# Patient Record
Sex: Female | Born: 1980 | Race: Black or African American | Hispanic: No | Marital: Single | Smoking: Never smoker
Health system: Southern US, Community
[De-identification: ages and names within clinical notes are randomized; demographics above are authoritative.]

## PROBLEM LIST (undated history)

## (undated) ENCOUNTER — Inpatient Hospital Stay (HOSPITAL_COMMUNITY): Payer: Self-pay

## (undated) DIAGNOSIS — F329 Major depressive disorder, single episode, unspecified: Secondary | ICD-10-CM

## (undated) DIAGNOSIS — K219 Gastro-esophageal reflux disease without esophagitis: Secondary | ICD-10-CM

## (undated) DIAGNOSIS — Z9289 Personal history of other medical treatment: Secondary | ICD-10-CM

## (undated) DIAGNOSIS — D649 Anemia, unspecified: Secondary | ICD-10-CM

## (undated) DIAGNOSIS — R87619 Unspecified abnormal cytological findings in specimens from cervix uteri: Secondary | ICD-10-CM

## (undated) DIAGNOSIS — R7303 Prediabetes: Secondary | ICD-10-CM

## (undated) DIAGNOSIS — R7989 Other specified abnormal findings of blood chemistry: Secondary | ICD-10-CM

## (undated) DIAGNOSIS — F419 Anxiety disorder, unspecified: Secondary | ICD-10-CM

## (undated) DIAGNOSIS — B009 Herpesviral infection, unspecified: Secondary | ICD-10-CM

## (undated) DIAGNOSIS — IMO0002 Reserved for concepts with insufficient information to code with codable children: Secondary | ICD-10-CM

## (undated) DIAGNOSIS — F32A Depression, unspecified: Secondary | ICD-10-CM

## (undated) HISTORY — DX: Other specified abnormal findings of blood chemistry: R79.89

## (undated) HISTORY — DX: Prediabetes: R73.03

## (undated) HISTORY — PX: OTHER SURGICAL HISTORY: SHX169

## (undated) HISTORY — DX: Herpesviral infection, unspecified: B00.9

## (undated) HISTORY — DX: Anxiety disorder, unspecified: F41.9

## (undated) HISTORY — DX: Unspecified abnormal cytological findings in specimens from cervix uteri: R87.619

## (undated) HISTORY — DX: Reserved for concepts with insufficient information to code with codable children: IMO0002

## (undated) HISTORY — DX: Depression, unspecified: F32.A

---
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
## 1999-05-03 ENCOUNTER — Encounter (INDEPENDENT_AMBULATORY_CARE_PROVIDER_SITE_OTHER): Payer: Self-pay

## 1999-05-03 ENCOUNTER — Other Ambulatory Visit: Admission: RE | Admit: 1999-05-03 | Discharge: 1999-05-03 | Payer: Self-pay | Admitting: *Deleted

## 1999-05-03 ENCOUNTER — Other Ambulatory Visit: Admission: RE | Admit: 1999-05-03 | Discharge: 1999-05-03 | Payer: Self-pay | Admitting: Obstetrics

## 2000-04-01 ENCOUNTER — Emergency Department (HOSPITAL_COMMUNITY): Admission: EM | Admit: 2000-04-01 | Discharge: 2000-04-01 | Payer: Self-pay | Admitting: Emergency Medicine

## 2000-04-03 ENCOUNTER — Emergency Department (HOSPITAL_COMMUNITY): Admission: EM | Admit: 2000-04-03 | Discharge: 2000-04-03 | Payer: Self-pay | Admitting: Emergency Medicine

## 2000-04-05 ENCOUNTER — Emergency Department (HOSPITAL_COMMUNITY): Admission: EM | Admit: 2000-04-05 | Discharge: 2000-04-05 | Payer: Self-pay | Admitting: Emergency Medicine

## 2000-04-11 ENCOUNTER — Inpatient Hospital Stay (HOSPITAL_COMMUNITY): Admission: AD | Admit: 2000-04-11 | Discharge: 2000-04-11 | Payer: Self-pay | Admitting: *Deleted

## 2000-05-06 ENCOUNTER — Inpatient Hospital Stay (HOSPITAL_COMMUNITY): Admission: AD | Admit: 2000-05-06 | Discharge: 2000-05-06 | Payer: Self-pay | Admitting: Obstetrics and Gynecology

## 2000-05-23 ENCOUNTER — Inpatient Hospital Stay (HOSPITAL_COMMUNITY): Admission: AD | Admit: 2000-05-23 | Discharge: 2000-05-23 | Payer: Self-pay | Admitting: Obstetrics and Gynecology

## 2000-05-29 ENCOUNTER — Other Ambulatory Visit: Admission: RE | Admit: 2000-05-29 | Discharge: 2000-05-29 | Payer: Self-pay | Admitting: Obstetrics and Gynecology

## 2000-07-10 ENCOUNTER — Ambulatory Visit (HOSPITAL_COMMUNITY): Admission: RE | Admit: 2000-07-10 | Discharge: 2000-07-10 | Payer: Self-pay | Admitting: Obstetrics and Gynecology

## 2000-07-10 ENCOUNTER — Encounter: Payer: Self-pay | Admitting: Obstetrics and Gynecology

## 2000-11-28 ENCOUNTER — Inpatient Hospital Stay (HOSPITAL_COMMUNITY): Admission: AD | Admit: 2000-11-28 | Discharge: 2000-11-30 | Payer: Self-pay | Admitting: Obstetrics and Gynecology

## 2002-01-05 ENCOUNTER — Emergency Department (HOSPITAL_COMMUNITY): Admission: EM | Admit: 2002-01-05 | Discharge: 2002-01-06 | Payer: Self-pay | Admitting: Emergency Medicine

## 2003-07-19 ENCOUNTER — Emergency Department (HOSPITAL_COMMUNITY): Admission: EM | Admit: 2003-07-19 | Discharge: 2003-07-19 | Payer: Self-pay | Admitting: Emergency Medicine

## 2003-09-21 ENCOUNTER — Encounter: Admission: RE | Admit: 2003-09-21 | Discharge: 2003-09-21 | Payer: Self-pay | Admitting: Family Medicine

## 2005-03-29 ENCOUNTER — Inpatient Hospital Stay (HOSPITAL_COMMUNITY): Admission: AD | Admit: 2005-03-29 | Discharge: 2005-03-29 | Payer: Self-pay | Admitting: *Deleted

## 2005-05-06 ENCOUNTER — Other Ambulatory Visit: Admission: RE | Admit: 2005-05-06 | Discharge: 2005-05-06 | Payer: Self-pay | Admitting: Obstetrics and Gynecology

## 2005-11-05 ENCOUNTER — Encounter (INDEPENDENT_AMBULATORY_CARE_PROVIDER_SITE_OTHER): Payer: Self-pay | Admitting: *Deleted

## 2005-11-05 ENCOUNTER — Inpatient Hospital Stay (HOSPITAL_COMMUNITY): Admission: AD | Admit: 2005-11-05 | Discharge: 2005-11-07 | Payer: Self-pay | Admitting: Obstetrics and Gynecology

## 2006-09-25 ENCOUNTER — Emergency Department (HOSPITAL_COMMUNITY): Admission: EM | Admit: 2006-09-25 | Discharge: 2006-09-25 | Payer: Self-pay | Admitting: Emergency Medicine

## 2009-02-17 ENCOUNTER — Emergency Department (HOSPITAL_COMMUNITY): Admission: EM | Admit: 2009-02-17 | Discharge: 2009-02-17 | Payer: Self-pay | Admitting: Emergency Medicine

## 2009-12-12 ENCOUNTER — Inpatient Hospital Stay (HOSPITAL_COMMUNITY): Admission: EM | Admit: 2009-12-12 | Discharge: 2009-12-13 | Payer: Self-pay | Admitting: Emergency Medicine

## 2010-08-18 LAB — DIFFERENTIAL
Basophils Absolute: 0 10*3/uL (ref 0.0–0.1)
Basophils Relative: 0 % (ref 0–1)
Eosinophils Relative: 0 % (ref 0–5)
Monocytes Absolute: 0.7 10*3/uL (ref 0.1–1.0)
Monocytes Relative: 10 % (ref 3–12)

## 2010-08-18 LAB — POCT CARDIAC MARKERS
Myoglobin, poc: 30.5 ng/mL (ref 12–200)
Troponin i, poc: <0.05 ng/mL (ref 0.00–0.09)

## 2010-08-18 LAB — CROSSMATCH: Antibody Screen: NEGATIVE

## 2010-08-18 LAB — CBC
HCT: 26.3 % — ABNORMAL LOW (ref 36.0–46.0)
HCT: 31.4 % — ABNORMAL LOW (ref 36.0–46.0)
Hemoglobin: 10.1 g/dL — ABNORMAL LOW (ref 12.0–15.0)
Hemoglobin: 8.2 g/dL — ABNORMAL LOW (ref 12.0–15.0)
MCH: 22.4 pg — ABNORMAL LOW (ref 26.0–34.0)
MCHC: 31 g/dL (ref 30.0–36.0)
MCHC: 32.2 g/dL (ref 30.0–36.0)
MCV: 72.2 fL — ABNORMAL LOW (ref 78.0–100.0)
RBC: 4.14 MIL/uL (ref 3.87–5.11)
RDW: 19 % — ABNORMAL HIGH (ref 11.5–15.5)
WBC: 7.4 10*3/uL (ref 4.0–10.5)

## 2010-08-18 LAB — ABO/RH: ABO/RH(D): B POS

## 2010-08-18 LAB — BASIC METABOLIC PANEL
BUN: 8 mg/dL (ref 6–23)
CO2: 24 mEq/L (ref 19–32)
Calcium: 8.4 mg/dL (ref 8.4–10.5)
Chloride: 109 mEq/L (ref 96–112)
Creatinine, Ser: 0.87 mg/dL (ref 0.4–1.2)
GFR calc Af Amer: >60 mL/min (ref >60)
GFR calc non Af Amer: >60 mL/min (ref >60)
Glucose, Bld: 91 mg/dL (ref 70–99)
Potassium: 4.5 mEq/L (ref 3.5–5.1)
Sodium: 138 mEq/L (ref 135–145)

## 2010-08-18 LAB — VITAMIN B12: Vitamin B-12: 335 pg/mL (ref 211–911)

## 2010-08-18 LAB — IRON AND TIBC
Saturation Ratios: 4 % — ABNORMAL LOW (ref 20–55)
UIBC: 405 ug/dL

## 2010-08-18 LAB — FOLATE: Folate: 15.5 ng/mL

## 2010-08-18 LAB — RETICULOCYTES
RBC.: 3.67 MIL/uL — ABNORMAL LOW (ref 3.87–5.11)
Retic Ct Pct: 0.8 % (ref 0.4–3.1)

## 2010-08-18 LAB — T4, FREE: Free T4: 1.03 ng/dL (ref 0.80–1.80)

## 2010-08-18 LAB — SICKLE CELL SCREEN: Sickle Cell Screen: NEGATIVE

## 2010-08-18 LAB — HEMOCCULT GUIAC POC 1CARD (OFFICE): Fecal Occult Bld: NEGATIVE

## 2010-10-18 NOTE — Discharge Summary (Signed)
Karla [name], Karla [name]                ACCOUNT NO.:  [id number]   MEDICAL RECORD NO.:  [id number]          PATIENT TYPE:  INP   LOCATION:  9148                          FACILITY:  [facility]   PHYSICIAN:  Malachi Pro. Ambrose Mantle, M.D. DATE OF BIRTH:  [birth year]   DATE OF ADMISSION:  11/05/2005  DATE OF DISCHARGE:  11/07/2005                                 DISCHARGE SUMMARY   This is a 30 year old black female para 1-0-0-1, gravida 2; estimated  gestational age [redacted] weeks 4 days by last period compatible with a 10-week  ultrasound with [employer] November 15, 2005.  Presented for induction of labor due to  ultrasound on June 5 with estimated fetal weight approximately 2500 g,  approximately the 10th percentile, with normal amniotic fluid volume and  favorable cervix.  Blood group and type B positive with a negative antibody,  RPR nonreactive, rubella immune, hepatitis B surface antigen negative, HIV  negative, GC and chlamydia negative, triple screen normal, 1-hour Glucola  87, group B strep negative.  The patient had nausea and vomiting of  pregnancy treated with Zofran.  Otherwise, uncomplicated prenatal course  except for size less than dates with ultrasound on May 8 showing a estimated  fetal weight of 2100 g, approximately the 30th percentile, and then the  above-mentioned ultrasound that suggested the baby was in the 10th  percentile.  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  She  had no lesions or symptoms.  Obstetrical history:  In 2002 she had a  spontaneous vaginal delivery at 38 weeks, a 6-pound infant without  complications.  Gynecological history:  Herpes simplex virus.  Past medical history:  Anemia.  Surgical history:  Negative.  No known drug allergies.  Medications:  Iron.  On physical examination, she was afebrile with normal  vital signs.  Heart and lungs were normal.  The fetal heart was reactive  with contractions every 3 minutes on Pitocin.  The abdomen was gravid and  nontender, estimated fetal weight about 6 pounds.  Cervix was 4-5 cm, 40%,  vertex at a -1 to -2.  Artificial rupture of the membranes produced clear  fluid.  The patient progressed in labor to 5 cm at 7:30 p.m.  At 8:30 p.m.  she was 6 cm, 70%.  There was slight decreased variability with questionable  early decelerations but positive scalp stimulation.  The patient progressed  to complete dilatation and pushed well and delivered spontaneously a living  female infant, 5 pounds 3 ounces, with Apgars of 8 at one and 9 at five  minutes.  The placenta was spontaneous and intact.  Cord blood collection  was taken.  Bilateral labial abrasions were sutured with 3-0 Vicryl used on  the right for hemostasis.  Blood loss was less than 500 mL.  Postpartum the  patient did well and was discharged on the second postpartum day.  Initial  hemoglobin was 9.0, hematocrit 27.3; white count 10,600; platelet count  427,000.  Followup hemoglobin 8.1.   FINAL DIAGNOSES:  1.  Intrauterine pregnancy at 38 weeks 4 days, delivered vertex.  2.  Small-for-gestational-age infant.   OPERATION:  Spontaneous delivery, vertex; repair of right labial laceration.   FINAL CONDITION:  Improved.   INSTRUCTIONS:  Include our regular discharge instruction booklet.  Percocet  5/325 #24 tablets one every 4-6 hours as needed for pain is given at  discharge.  The patient is advised to return in 6 weeks for followup  examination.      Malachi Pro. Ambrose Mantle, M.D.  Electronically Signed     TFH/MEDQ  D:  11/07/2005  T:  11/07/2005  Job:  [id number]

## 2010-10-18 NOTE — Discharge Summary (Signed)
[facility]  Patient:    Karla [name], Karla [name]                       MRN: [id number] Adm. Date:  [id number] Disc. Date: 11/30/00 Attending:  Malon Kindle                           Discharge Summary  HISTORY OF PRESENT ILLNESS:   The patient is a 30 year old, black single female, para 0, gravida 1, last period March 01, 2000, [facility] December 06, 2000, by dates and December 09, 2000, by ultrasound, admitted with ruptured membranes and contractions.  Blood group and type B positive with a negative antibody. Sickle cell negative.  VDRL negative.  Rubella equivocal.  Hepatitis B surface antigen negative.  HIV negative.  GC and Chlamydia negative.  Triple screen normal.  One-hour Glucola 102.  Group B strep positive.  Vaginal ultrasound, April 22, 2000, crown-rump length 0.9 cm, 7 weeks 0 days, [facility] December 09, 2000.  Repeat ultrasound, July 10, 2000, average gestational age, 39 weeks, 2 days, [facility] December 09, 2000.  The patient was treated with Chromagen for anemia. She was placed on Valtrex 500 mg by mouth daily on November 10, 2000, for herpes suppression.  She came to our office on November 27, 2000, for questionable rupture of membranes.  Rupture of membranes was not confirmed.  On the day of admission, at approximately 2 a.m. she began contracting and at 4 a.m. had rupture of membranes.  ALLERGIES:                    MACROBID caused vomiting.  PAST MEDICAL HISTORY:         Illnesses:  Herpes simplex virus.  Anemia. Operations:  None.  FAMILY HISTORY:               No close family history of significance.  ALCOHOL/TOBACCO/DRUGS:        None.  PHYSICAL EXAMINATION:  VITAL SIGNS:                  Physical examination on admission revealed normal vital signs.  ABDOMEN:                      Soft, fundal height 36 cm on November 17, 2000. Fetal heart tones showed poor reactivity but no significant decelerations.  PELVIC:                       Cervix was 2+ cm,  70-80% effaced, vertex -2, membranes were present even though she had already the membranes were ruptured and that had been confirmed.  Artificial rupture of membranes was done by me with minimal fluid released.  HOSPITAL COURSE:              Impression on admission was intrauterine pregnancy, at 38 weeks, early labor, positive group B streptococcus.  The patient was placed on IV penicillin with patient progressed to 3 cm and received and epidural at 10 a.m.  She was 5 cm at 11 a.m. and fully dilated at approximately 12 noon.  The patient had some episodes of fetal heart rate decelerations and a low forceps delivery was done after an epidural boost with a silver dollar of cap when progressed slowed.  The infant was female 6 pounds 0 ounces, Apgars 9 at one and 9 at five minutes,  placenta intact.  Uterus normal.  Rectal negative.  There were bilateral labial lacerations and no midline lacerations.  Right labial laceration repaired with 3-0 Dexon.  Left labial laceration hemostatic and not sutured.  Blood loss about 400 cc.  Postpartum, the patient did quite well and was discharged on the second postpartum day.  Initial hemoglobin 10.2, hematocrit 29.9, platelet count 255,000, white count 13,600.  Followup hemoglobin 8.3, hematocrit 24.3, white count 22,200, RPR was nonreactive.  FINAL DIAGNOSES:              Intrauterine pregnancy, at 38 weeks, delivered left occipitoanterior position.  OPERATION:                    Low forceps delivery left occipitoanterior position, repair of right labial laceration.  CONDITION:                    Improved.  INSTRUCTIONS:                 Our discharge instruction booklet.  The patient is also going to be given rubella vaccine because of the equivocal rubella titer and she will be given Depo-Provera at her request 150 mg IM prior to discharge.  She is to report any problems and return to the office in six weeks for followup examination. DD:   11/30/00 TD:  11/30/00 Job: [id number]

## 2012-04-08 ENCOUNTER — Encounter (HOSPITAL_COMMUNITY): Payer: Self-pay

## 2012-04-08 ENCOUNTER — Inpatient Hospital Stay (HOSPITAL_COMMUNITY)
Admission: AD | Admit: 2012-04-08 | Discharge: 2012-04-08 | Disposition: A | Discharge status: Home / Self Care | Payer: Medicaid Other | Source: Ambulatory Visit | Attending: Obstetrics and Gynecology | Admitting: Obstetrics and Gynecology

## 2012-04-08 DIAGNOSIS — O209 Hemorrhage in early pregnancy, unspecified: Secondary | ICD-10-CM | POA: Insufficient documentation

## 2012-04-08 HISTORY — DX: Anemia, unspecified: D64.9

## 2012-04-08 MED ORDER — PRENATAL PLUS 27-1 MG PO TABS: 1.0000 | ORAL_TABLET | Freq: Every day | ORAL | Status: DC

## 2012-04-08 NOTE — MAU Provider Note (Signed)
Chief Complaint  Patient presents with  . Vaginal Bleeding    9 weeks    SubjecPtive 31 y.o. [id number] @ [redacted]w[redacted]d byPatient's last menstrual period was 02/04/2012. presents with report of a gush of red blood after urinating. Has had pink spotting for 3 days. No cramping. No antecedent intercourse. Denies abnormal  vaginal discharge or irritation. No dysuria or hematuria. Has appoointment for NOB 04/16/12. Blood type:   No past medical history on file.  Objective   Filed Vitals:   04/08/12 0235  BP: 115/71  Pulse: 81  Temp: 98.4 F (36.9 C)  Resp: 18     Physical Exam: General: WN/WD in NAD  Abdom: soft, NT Pelvic:External genitalia: normal; BUS neg             Spec: small amount of dark red colored blood in vault                      Cx nulliparous, no lesions, appears closed           Bimanual: Cx closed, long; no CMT                             Uterus anteverted, NT, 10 weeks size                             Adnexae non tender, no masses    Lab Results:   Imaging:  Bedside US by me: cardiac activity and FM seen by pt  Assessment G3 P2002 with viable IUPat 9 weeks 1 day Early pregnancy bleeding    Plan    D/W Dr. Ellyn Hack Reassured. Discharge with SAB precautions, pelvic rest F/U as scheduled for NOB visit next week.   Gloris Shiroma Colin Mulders, CNM  3:22 AM

## 2012-04-08 NOTE — MAU Note (Signed)
Pt presents tonight with c/o vaginal bleeding X 1 with voiding approx. 1 am. Has had no bleeding since then and is not wearing a pad presently. Denies pain with the bleeding

## 2012-04-21 LAB — OB RESULTS CONSOLE ABO/RH: RH Type: POSITIVE

## 2012-04-21 LAB — OB RESULTS CONSOLE GBS: GBS: POSITIVE

## 2012-04-21 LAB — OB RESULTS CONSOLE RPR: RPR: NONREACTIVE

## 2012-06-02 NOTE — L&D Delivery Note (Signed)
Delivery Note Pt progressed to complete dilation and pushed well.  At 10:24 PM a viable female was delivered via Vaginal, Spontaneous Delivery (Presentation: ; Occiput Anterior).  APGAR 8: 9, ; weight pending.   Placenta status: Intact, Spontaneous.  Cord: 3 vessels with the following complications: none.  Anesthesia: Epidural  Episiotomy: None Lacerations: 1st degree and right labial abrasion Suture Repair: 3.0 vicryl rapide Est. Blood Loss (mL): 300cc  Mom to postpartum.  Baby to stay with mother. Pt plans circumcision in office. Pt would still like to proceed with pp BTL.  We reviewed the procedure again and epidural will be left in place with BTL scheduled for tomorrow.    Oliver Pila 10/27/2012, 10:38 PM

## 2012-06-16 LAB — US OB LIMITED

## 2012-07-25 ENCOUNTER — Inpatient Hospital Stay (HOSPITAL_COMMUNITY)
Admission: AD | Admit: 2012-07-25 | Discharge: 2012-07-25 | Disposition: A | Discharge status: Hospice | Payer: Medicaid Other | Source: Ambulatory Visit | Attending: Obstetrics and Gynecology | Admitting: Obstetrics and Gynecology

## 2012-07-25 ENCOUNTER — Encounter (HOSPITAL_COMMUNITY): Payer: Self-pay | Admitting: *Deleted

## 2012-07-25 DIAGNOSIS — D649 Anemia, unspecified: Secondary | ICD-10-CM | POA: Insufficient documentation

## 2012-07-25 DIAGNOSIS — R42 Dizziness and giddiness: Secondary | ICD-10-CM | POA: Insufficient documentation

## 2012-07-25 DIAGNOSIS — R51 Headache: Secondary | ICD-10-CM | POA: Insufficient documentation

## 2012-07-25 DIAGNOSIS — O99019 Anemia complicating pregnancy, unspecified trimester: Secondary | ICD-10-CM | POA: Insufficient documentation

## 2012-07-25 HISTORY — DX: Gastro-esophageal reflux disease without esophagitis: K21.9

## 2012-07-25 LAB — COMPREHENSIVE METABOLIC PANEL
Albumin: 2.9 g/dL — ABNORMAL LOW (ref 3.5–5.2)
Alkaline Phosphatase: 104 U/L (ref 39–117)
BUN: 7 mg/dL (ref 6–23)
CO2: 22 mEq/L (ref 19–32)
Chloride: 103 mEq/L (ref 96–112)
Creatinine, Ser: 0.6 mg/dL (ref 0.50–1.10)
GFR calc non Af Amer: >90 mL/min (ref >90)
Potassium: 3.7 mEq/L (ref 3.5–5.1)
Total Bilirubin: 0.2 mg/dL — ABNORMAL LOW (ref 0.3–1.2)

## 2012-07-25 LAB — URINALYSIS, ROUTINE W REFLEX MICROSCOPIC
Bilirubin Urine: NEGATIVE
Glucose, UA: NEGATIVE mg/dL
Hgb urine dipstick: NEGATIVE
Ketones, ur: 15 mg/dL — AB
Nitrite: NEGATIVE
Specific Gravity, Urine: 1.025 (ref 1.005–1.030)
pH: 6 (ref 5.0–8.0)

## 2012-07-25 LAB — CBC
HCT: 26 % — ABNORMAL LOW (ref 36.0–46.0)
Hemoglobin: 7.9 g/dL — ABNORMAL LOW (ref 12.0–15.0)
MCH: 22.4 pg — ABNORMAL LOW (ref 26.0–34.0)
MCV: 73.9 fL — ABNORMAL LOW (ref 78.0–100.0)
Platelets: 307 10*3/uL (ref 150–400)
RBC: 3.52 MIL/uL — ABNORMAL LOW (ref 3.87–5.11)
WBC: 11.7 10*3/uL — ABNORMAL HIGH (ref 4.0–10.5)

## 2012-07-25 LAB — URINE MICROSCOPIC-ADD ON

## 2012-07-25 NOTE — MAU Provider Note (Signed)
History     CSN: [id number]  Arrival date and time: 07/25/12 1530   None     No chief complaint on file.  HPI 32 y.o. G3P2 at [redacted]w[redacted]d with episode of dizziness, lightheadedness and seeing spots while sitting at work x about 10-15 minutes. No n/v. Reports headaches intermittently for the last couple of days. Has ongoing issue with chronic anemia, unknown cause for her anemia, currently taking Integra iron supplement BID.    Past Medical History  Diagnosis Date  . Anemia     No past surgical history on file.  No family history on file.  History  Substance Use Topics  . Smoking status: Never Smoker   . Smokeless tobacco: Not on file  . Alcohol Use: No     Comment: NOT WHILE PREG    Allergies: No Known Allergies  Prescriptions prior to admission  Medication Sig Dispense Refill  . prenatal vitamin w/FE, FA (PRENATAL 1 + 1) 27-1 MG TABS Take 1 tablet by mouth daily.  30 each  0    Review of Systems  Constitutional: Positive for malaise/fatigue.  Respiratory: Negative.   Cardiovascular: Negative.  Negative for palpitations.  Gastrointestinal: Negative for nausea, vomiting, abdominal pain, diarrhea and constipation.  Genitourinary: Negative for dysuria, urgency, frequency, hematuria and flank pain.       Negative for vaginal bleeding  Musculoskeletal: Negative.   Neurological: Positive for dizziness and headaches.  Psychiatric/Behavioral: Negative.    Physical Exam   Last menstrual period 02/04/2012, unknown if currently breastfeeding.  Physical Exam  Nursing note and vitals reviewed. Constitutional: She is oriented to person, place, and time. She appears well-developed and well-nourished. No distress.  Cardiovascular: Normal rate.   Respiratory: Effort normal.  GI: Soft. There is no tenderness.  Musculoskeletal: Normal range of motion.  Neurological: She is alert and oriented to person, place, and time.  Skin: Skin is warm and dry.  Psychiatric: She has a normal  mood and affect.   EFM: 145, moderate variability, + accels, reassuring at 24 weeks, TOCO quiet  MAU Course  Procedures  Results for orders placed during the hospital encounter of 07/25/12 (from the past 24 hour(s))  CBC     Status: Abnormal   Collection Time    07/25/12  3:38 PM      Result Value Range   WBC 11.7 (*) 4.0 - 10.5 K/uL   RBC 3.52 (*) 3.87 - 5.11 MIL/uL   Hemoglobin 7.9 (*) 12.0 - 15.0 g/dL   HCT 40.9 (*) 81.1 - 91.4 %   MCV 73.9 (*) 78.0 - 100.0 fL   MCH 22.4 (*) 26.0 - 34.0 pg   MCHC 30.4  30.0 - 36.0 g/dL   RDW 78.2 (*) 95.6 - 21.3 %   Platelets 307  150 - 400 K/uL  COMPREHENSIVE METABOLIC PANEL     Status: Abnormal   Collection Time    07/25/12  3:38 PM      Result Value Range   Sodium 134 (*) 135 - 145 mEq/L   Potassium 3.7  3.5 - 5.1 mEq/L   Chloride 103  96 - 112 mEq/L   CO2 22  19 - 32 mEq/L   Glucose, Bld 104 (*) 70 - 99 mg/dL   BUN 7  6 - 23 mg/dL   Creatinine, Ser 0.86  0.50 - 1.10 mg/dL   Calcium 8.5  8.4 - 57.8 mg/dL   Total Protein 7.1  6.0 - 8.3 g/dL   Albumin 2.9 (*) 3.5 -  5.2 g/dL   AST 13  0 - 37 U/L   ALT 5  0 - 35 U/L   Alkaline Phosphatase 104  39 - 117 U/L   Total Bilirubin 0.2 (*) 0.3 - 1.2 mg/dL   GFR calc non Af Amer >90  >90 mL/min   GFR calc Af Amer >90  >90 mL/min  URINALYSIS, ROUTINE W REFLEX MICROSCOPIC     Status: Abnormal   Collection Time    07/25/12  3:53 PM      Result Value Range   Color, Urine YELLOW  YELLOW   APPearance HAZY (*) CLEAR   Specific Gravity, Urine 1.025  1.005 - 1.030   pH 6.0  5.0 - 8.0   Glucose, UA NEGATIVE  NEGATIVE mg/dL   Hgb urine dipstick NEGATIVE  NEGATIVE   Bilirubin Urine NEGATIVE  NEGATIVE   Ketones, ur 15 (*) NEGATIVE mg/dL   Protein, ur NEGATIVE  NEGATIVE mg/dL   Urobilinogen, UA 2.0 (*) 0.0 - 1.0 mg/dL   Nitrite NEGATIVE  NEGATIVE   Leukocytes, UA SMALL (*) NEGATIVE  URINE MICROSCOPIC-ADD ON     Status: Abnormal   Collection Time    07/25/12  3:53 PM      Result Value Range    Squamous Epithelial / LPF FEW (*) RARE   WBC, UA 3-6  <3 WBC/hpf   RBC / HPF 0-2  <3 RBC/hpf   Bacteria, UA FEW (*) RARE   Urine-Other MUCOUS PRESENT       Assessment and Plan  32 y.o. G3P2 at [redacted]w[redacted]d with chronic anemia Spoke to Dr. Senaida Ores, she will discuss with pt's primary OB, Dr. Ellyn Hack, tomorrow for plan of care Pt will call office tomorrow Precautions rev'd  [facility] 07/25/2012, 3:33 PM

## 2012-07-25 NOTE — MAU Note (Signed)
Pt says she was at work and started to get dizzy and see spots.  Says she drank 1 1/2 bottles of water and a cup of OJ.

## 2012-07-27 LAB — URINE CULTURE: Colony Count: 25000

## 2012-07-28 ENCOUNTER — Telehealth: Payer: Self-pay | Admitting: Oncology

## 2012-07-28 NOTE — Telephone Encounter (Signed)
Not able to leave message

## 2012-08-03 ENCOUNTER — Telehealth: Payer: Self-pay | Admitting: Oncology

## 2012-08-03 NOTE — Telephone Encounter (Signed)
S/W PT IN RE NP APPT 03/12 @ 10:30 W/DR. HA REFERRING - [facility] DX- CHRONIC ANEMIA HGB 7.9 WELCOME PACKET MAILED.

## 2012-08-03 NOTE — Telephone Encounter (Signed)
C/D 08/03/12 for appt. 08/11/12

## 2012-08-11 ENCOUNTER — Telehealth: Payer: Self-pay | Admitting: Oncology

## 2012-08-11 ENCOUNTER — Ambulatory Visit (HOSPITAL_BASED_OUTPATIENT_CLINIC_OR_DEPARTMENT_OTHER): Payer: Medicaid Other | Admitting: Oncology

## 2012-08-11 ENCOUNTER — Ambulatory Visit: Payer: Medicaid Other

## 2012-08-11 ENCOUNTER — Encounter: Payer: Self-pay | Admitting: Oncology

## 2012-08-11 ENCOUNTER — Other Ambulatory Visit (HOSPITAL_BASED_OUTPATIENT_CLINIC_OR_DEPARTMENT_OTHER): Payer: Medicaid Other | Admitting: Lab

## 2012-08-11 VITALS — BP 125/79 | HR 85 | Temp 97.5°F | Resp 18 | Ht 66.0 in | Wt 207.5 lb

## 2012-08-11 LAB — MORPHOLOGY: PLT EST: ADEQUATE

## 2012-08-11 LAB — CBC WITH DIFFERENTIAL/PLATELET
Basophils Absolute: 0 10*3/uL (ref 0.0–0.1)
EOS%: 0 % (ref 0.0–7.0)
Eosinophils Absolute: 0 10*3/uL (ref 0.0–0.5)
HGB: 7.2 g/dL — ABNORMAL LOW (ref 11.6–15.9)
LYMPH%: 19.1 % (ref 14.0–49.7)
MCH: 22.4 pg — ABNORMAL LOW (ref 25.1–34.0)
MCV: 73.3 fL — ABNORMAL LOW (ref 79.5–101.0)
MONO%: 7.5 % (ref 0.0–14.0)
NEUT#: 6.8 10*3/uL — ABNORMAL HIGH (ref 1.5–6.5)
NEUT%: 73.3 % (ref 38.4–76.8)
Platelets: 279 10*3/uL (ref 145–400)

## 2012-08-11 LAB — CHCC SMEAR

## 2012-08-11 LAB — FERRITIN: Ferritin: 5 ng/mL — ABNORMAL LOW (ref 10–291)

## 2012-08-11 NOTE — Progress Notes (Signed)
Checked in new patient. No financial issues. °

## 2012-08-11 NOTE — Progress Notes (Signed)
[facility]  [phone] Fax:([phone]) [phone]     INITIAL HEMATOLOGY CONSULTATION    Referral MD:  Dr. Malachi Pro. Ambrose Mantle, M.D.   Reason for Referral:  Iron deficiency anemia.     HPI: Karla [name] is a 32 year-old woman with history of menorrhagia ever since she was bout 32 years old.  She tried OCP in the past without resolution of her menorrhagia.  She even required pRBC transfusion in 2011. She has been on oral iron without improvement of her anemia.  She was thus kindly referred to the [facility] for evaluation.   Karla [name] presented to the [facility] today for the first time by herself.  She reported that her menstrual cycle is regular every 28 days.  Each time it lasts about 4-5 days. The first 2 days are very heavy when she changes her pads every 2 hours and they aresoaked through.  Before this procedure was on oral iron over the counter. She has been on Integra since this pregnancy.  She reports mild to moderate fatigue. 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 She has mild dyspnea exertion source of breath; however, she attributed to normal pregnancy.  She sees occasional eye floaters.    Patient denies fever, anorexia, weight loss, headache, confusion, drenching night sweats, palpable lymph node swelling, mucositis, odynophagia, dysphagia, nausea vomiting, jaundice, chest pain, palpitation, productive cough, gum bleeding, epistaxis, hematemesis, hemoptysis, abdominal pain, abdominal swelling, early satiety, melena, hematochezia, hematuria, skin rash, spontaneous bleeding, joint swelling, joint pain, heat or cold intolerance, bowel bladder incontinence, back pain, focal motor weakness, paresthesia, depression.     Past Medical History  Diagnosis Date  . Anemia     since 32 y.o.    . GERD (gastroesophageal reflux disease)   :    No past surgical history on file.:   CURRENT MEDS: Current Outpatient  Prescriptions  Medication Sig Dispense Refill  . Fe Fum-FePoly-Vit C-Vit B3 (INTEGRA) 62.5-62.5-40-3 MG CAPS Take 1 capsule by mouth 2 (two) times daily.       No current facility-administered medications for this visit.      No Known Allergies:  Family History  Problem Relation Age of Onset  . Diabetes Father   . Diabetes Maternal Grandmother   . Hypertension Maternal Grandmother   . Diabetes Maternal Grandfather   . Hypertension Maternal Grandfather   . Diabetes Paternal Grandmother   . Hypertension Paternal Grandmother   . Anemia Paternal Grandmother   . Diabetes Paternal Grandfather   . Hypertension Paternal Grandfather   . Cancer Paternal Grandfather     colon cancer  . Anemia Cousin   :  History   Social History  . Marital Status: Single    Spouse Name: N/A    Number of Children: 2  . Years of Education: N/A   Occupational History  .      mental health professional    Social History Main Topics  . Smoking status: Never Smoker   . Smokeless tobacco: Never Used  . Alcohol Use: No     Comment: NOT WHILE PREG  . Drug Use: No  . Sexually Active: Not Currently   Other Topics Concern  . Not on file   Social History Narrative  . No narrative on file  :  REVIEW OF SYSTEM:  The rest of the 14-point review of sytem was negative.   Exam: ECOG: 0-1  General:  well-nourished woman, in no acute  distress.  Eyes:  no scleral icterus.  ENT:  There were no oropharyngeal lesions.  Neck was without thyromegaly.  Lymphatics:  Negative cervical, supraclavicular or axillary adenopathy.  Respiratory: lungs were clear bilaterally without wheezing or crackles.  Cardiovascular:  Regular rate and rhythm, S1/S2, without murmur, rub or gallop.  There was no pedal edema. Muscoloskeletal:  no spinal tenderness of palpation of vertebral spine.  Skin exam was without echymosis, petichae.  Neuro exam was nonfocal.  Patient was able to get on and off exam table without assistance.  Gait  was normal.  Patient was alerted and oriented.  Attention was good.   Language was appropriate.  Mood was normal without depression.  Speech was not pressured.  Thought content was not tangential.    LABS:  Lab Results  Component Value Date   WBC 9.3 08/11/2012   HGB 7.2* 08/11/2012   HCT 23.6* 08/11/2012   PLT 279 08/11/2012   GLUCOSE 104* 07/25/2012   ALT 5 07/25/2012   AST 13 07/25/2012   NA 134* 07/25/2012   K 3.7 07/25/2012   CL 103 07/25/2012   CREATININE 0.60 07/25/2012   BUN 7 07/25/2012   CO2 22 07/25/2012   Blood smear review:   I personally reviewed the patient's peripheral blood smear today.  There was anisocytosis.  There was microcytosis.  There was no peripheral blast.  There was no schistocytosis, spherocytosis, target cell, rouleaux formation, tear drop cell.  There was no giant platelets or platelet clumps.      ASSESSMENT AND PLAN:   1.  Issue:  Iron deficiency anemia due to most likely menstrual blood loss.  There is no family history of sickle cell anemia thalassemia. There is no family history of bleeding diathesis to suggest coagulopathy.  2.  Recommendation:  She has not had improvement of her iron store and anemia with multiple oral iron formulations.  I recommend IV iron within the next few days at [facility] hospital. It is a policy of the cancer Center not to administer products that can potentially cause infusion reaction because there is no capacity for fetal monitoring at the cancer Center. The risk of infusion reaction with IV iron is about 1%. Intravenous iron works very rapidly within a week or 10 days to improve her hemoglobin. Therefore, I prefer to defer blood transfusion unless Hemoglobin continues to decrease.  I recommend Feraheme 1,020 mg IV x 1 (over 30 minutes) with premedication with Tylenol 325mg  PO x 1 and Benadryl 25mg  PO x 1.   I advised her of potential myalgia and arthralgia with IV iron.  She expressed informed understanding and wished to proceed.    3.  Follow up:  Lab test here at the [facility] every 2 weeks to ensure improved hemoglobin.  Return to clinic in about 4 months.   The length of time of the face-to-face encounter was 30 minutes. More than 50% of time was spent counseling and coordination of care.     Thank you for this referral.

## 2012-08-11 NOTE — Telephone Encounter (Signed)
Gave pt appty for labs q 2 weeks and ML visit  on July 2014 with labs

## 2012-08-11 NOTE — Patient Instructions (Addendum)
1.  Issue:  Iron deficiency anemia due to most likely menstrual blood loss. 2.  Recommendation:  IV iron within the next few days at [facility] hospital.  I prefer to defer blood transfusion unless Hemoglobin continues to decrease. 3.  Follow up:  Lab test every 2 weeks to ensure improved hemoglobin.  Return to clinic in about 4 months.

## 2012-08-12 ENCOUNTER — Other Ambulatory Visit: Payer: Self-pay | Admitting: Oncology

## 2012-08-12 DIAGNOSIS — D500 Iron deficiency anemia secondary to blood loss (chronic): Secondary | ICD-10-CM

## 2012-08-13 ENCOUNTER — Encounter (HOSPITAL_COMMUNITY): Payer: Self-pay

## 2012-08-13 ENCOUNTER — Inpatient Hospital Stay (HOSPITAL_COMMUNITY)
Admission: AD | Admit: 2012-08-13 | Discharge: 2012-08-13 | Disposition: A | Discharge status: Home / Self Care | Payer: Medicaid Other | Source: Ambulatory Visit | Attending: Obstetrics and Gynecology | Admitting: Obstetrics and Gynecology

## 2012-08-13 DIAGNOSIS — O99019 Anemia complicating pregnancy, unspecified trimester: Secondary | ICD-10-CM | POA: Insufficient documentation

## 2012-08-13 DIAGNOSIS — D500 Iron deficiency anemia secondary to blood loss (chronic): Secondary | ICD-10-CM | POA: Insufficient documentation

## 2012-08-13 DIAGNOSIS — IMO0002 Reserved for concepts with insufficient information to code with codable children: Secondary | ICD-10-CM | POA: Insufficient documentation

## 2012-08-13 DIAGNOSIS — R5381 Other malaise: Secondary | ICD-10-CM | POA: Insufficient documentation

## 2012-08-13 MED ORDER — ACETAMINOPHEN 325 MG PO TABS
650.0000 mg | ORAL_TABLET | Freq: Once | ORAL | Status: AC
  Administered 2012-08-13: 650 mg via ORAL
  Filled 2012-08-13: qty 2

## 2012-08-13 MED ORDER — DIPHENHYDRAMINE HCL 25 MG PO CAPS
25.0000 mg | ORAL_CAPSULE | Freq: Once | ORAL | Status: AC
  Administered 2012-08-13: 25 mg via ORAL
  Filled 2012-08-13: qty 1

## 2012-08-13 MED ORDER — FERUMOXYTOL INJECTION 510 MG/17 ML
1020.0000 mg | Freq: Once | INTRAVENOUS | Status: AC
  Administered 2012-08-13: 1020 mg via INTRAVENOUS
  Filled 2012-08-13: qty 34

## 2012-08-13 NOTE — MAU Note (Signed)
C/o fatigue here for IV iron.

## 2012-08-13 NOTE — Progress Notes (Signed)
S:  Pt is a G3P2002 here at 27.2 wks IUP for IV iron per private MD.  Reports fatigue.  No SOB or chest pain. O: General:  A&OX3, NAD CVS:  RRR, without M/G/R, lungs ctab A:  Anemia P: IV iron per private md orders. [facility]

## 2012-08-23 ENCOUNTER — Encounter: Payer: Self-pay | Admitting: Oncology

## 2012-08-25 ENCOUNTER — Other Ambulatory Visit (HOSPITAL_BASED_OUTPATIENT_CLINIC_OR_DEPARTMENT_OTHER): Payer: Medicaid Other | Admitting: Lab

## 2012-08-25 DIAGNOSIS — O99891 Other specified diseases and conditions complicating pregnancy: Secondary | ICD-10-CM

## 2012-08-25 DIAGNOSIS — D509 Iron deficiency anemia, unspecified: Secondary | ICD-10-CM

## 2012-08-25 LAB — CBC WITH DIFFERENTIAL/PLATELET
Basophils Absolute: 0 10*3/uL (ref 0.0–0.1)
EOS%: 0 % (ref 0.0–7.0)
HCT: 27.7 % — ABNORMAL LOW (ref 34.8–46.6)
HGB: 8.5 g/dL — ABNORMAL LOW (ref 11.6–15.9)
LYMPH%: 17 % (ref 14.0–49.7)
MCH: 24.5 pg — ABNORMAL LOW (ref 25.1–34.0)
NEUT%: 75.6 % (ref 38.4–76.8)
Platelets: 261 10*3/uL (ref 145–400)
lymph#: 1.9 10*3/uL (ref 0.9–3.3)

## 2012-09-08 ENCOUNTER — Other Ambulatory Visit (HOSPITAL_BASED_OUTPATIENT_CLINIC_OR_DEPARTMENT_OTHER): Payer: Medicaid Other | Admitting: Lab

## 2012-09-08 ENCOUNTER — Encounter: Payer: Self-pay | Admitting: Oncology

## 2012-09-08 DIAGNOSIS — D509 Iron deficiency anemia, unspecified: Secondary | ICD-10-CM

## 2012-09-08 LAB — CBC WITH DIFFERENTIAL/PLATELET
BASO%: 0 % (ref 0.0–2.0)
EOS%: 0 % (ref 0.0–7.0)
HCT: 31.5 % — ABNORMAL LOW (ref 34.8–46.6)
HGB: 9.8 g/dL — ABNORMAL LOW (ref 11.6–15.9)
LYMPH%: 17.5 % (ref 14.0–49.7)
MCH: 26.2 pg (ref 25.1–34.0)
MCV: 84.2 fL (ref 79.5–101.0)
NEUT%: 75.8 % (ref 38.4–76.8)
lymph#: 1.7 10*3/uL (ref 0.9–3.3)

## 2012-09-22 ENCOUNTER — Encounter: Payer: Self-pay | Admitting: Oncology

## 2012-09-22 ENCOUNTER — Other Ambulatory Visit (HOSPITAL_BASED_OUTPATIENT_CLINIC_OR_DEPARTMENT_OTHER): Payer: Medicaid Other | Admitting: Lab

## 2012-09-22 DIAGNOSIS — D509 Iron deficiency anemia, unspecified: Secondary | ICD-10-CM

## 2012-09-22 LAB — CBC WITH DIFFERENTIAL/PLATELET
BASO%: 0.2 % (ref 0.0–2.0)
EOS%: 0 % (ref 0.0–7.0)
MCH: 27.9 pg (ref 25.1–34.0)
MCHC: 32.8 g/dL (ref 31.5–36.0)
RDW: 29.7 % — ABNORMAL HIGH (ref 11.2–14.5)
lymph#: 2 10*3/uL (ref 0.9–3.3)

## 2012-10-06 ENCOUNTER — Other Ambulatory Visit (HOSPITAL_BASED_OUTPATIENT_CLINIC_OR_DEPARTMENT_OTHER): Payer: Medicaid Other | Admitting: Lab

## 2012-10-06 ENCOUNTER — Encounter: Payer: Self-pay | Admitting: Oncology

## 2012-10-06 DIAGNOSIS — D509 Iron deficiency anemia, unspecified: Secondary | ICD-10-CM

## 2012-10-06 LAB — CBC WITH DIFFERENTIAL/PLATELET
Basophils Absolute: 0 10*3/uL (ref 0.0–0.1)
EOS%: 0 % (ref 0.0–7.0)
Eosinophils Absolute: 0 10*3/uL (ref 0.0–0.5)
HGB: 10.9 g/dL — ABNORMAL LOW (ref 11.6–15.9)
NEUT#: 7 10*3/uL — ABNORMAL HIGH (ref 1.5–6.5)
RDW: 27.1 % — ABNORMAL HIGH (ref 11.2–14.5)
WBC: 9.5 10*3/uL (ref 3.9–10.3)
lymph#: 2 10*3/uL (ref 0.9–3.3)

## 2012-10-06 LAB — IRON AND TIBC
%SAT: 16 % — ABNORMAL LOW (ref 20–55)
Iron: 65 ug/dL (ref 42–145)
UIBC: 330 ug/dL (ref 125–400)

## 2012-10-19 ENCOUNTER — Telehealth: Payer: Self-pay | Admitting: Oncology

## 2012-10-20 ENCOUNTER — Other Ambulatory Visit (HOSPITAL_BASED_OUTPATIENT_CLINIC_OR_DEPARTMENT_OTHER): Payer: Medicaid Other

## 2012-10-20 ENCOUNTER — Other Ambulatory Visit: Payer: Medicaid Other | Admitting: Lab

## 2012-10-20 DIAGNOSIS — D509 Iron deficiency anemia, unspecified: Secondary | ICD-10-CM

## 2012-10-20 LAB — CBC WITH DIFFERENTIAL/PLATELET
BASO%: 0.2 % (ref 0.0–2.0)
EOS%: 0 % (ref 0.0–7.0)
LYMPH%: 18.9 % (ref 14.0–49.7)
MCHC: 33.1 g/dL (ref 31.5–36.0)
MONO#: 0.7 10*3/uL (ref 0.1–0.9)
MONO%: 6.7 % (ref 0.0–14.0)
Platelets: 232 10*3/uL (ref 145–400)
RBC: 3.61 10*6/uL — ABNORMAL LOW (ref 3.70–5.45)
WBC: 10.9 10*3/uL — ABNORMAL HIGH (ref 3.9–10.3)

## 2012-10-21 ENCOUNTER — Other Ambulatory Visit: Payer: Self-pay | Admitting: Oncology

## 2012-10-21 ENCOUNTER — Encounter: Payer: Self-pay | Admitting: Oncology

## 2012-10-22 ENCOUNTER — Telehealth: Payer: Self-pay | Admitting: *Deleted

## 2012-10-22 NOTE — Telephone Encounter (Signed)
[name]  Asking her to inform Karla [name] that we have cancel her lab appts for 6/4 and 6/18 because her counts have been stable, and that she will have labs w/ ov on 12/10/12 @ 8:45am. i made her aware that i will mail a letter / cal as well...td

## 2012-10-26 ENCOUNTER — Telehealth (HOSPITAL_COMMUNITY): Payer: Self-pay | Admitting: *Deleted

## 2012-10-26 ENCOUNTER — Encounter (HOSPITAL_COMMUNITY): Payer: Self-pay | Admitting: *Deleted

## 2012-10-26 NOTE — Telephone Encounter (Signed)
Preadmission screen  

## 2012-10-27 ENCOUNTER — Encounter (HOSPITAL_COMMUNITY): Payer: Self-pay | Admitting: *Deleted

## 2012-10-27 ENCOUNTER — Inpatient Hospital Stay (HOSPITAL_COMMUNITY)
Admission: AD | Admit: 2012-10-27 | Discharge: 2012-10-29 | DRG: 767 | Disposition: A | Discharge status: Home / Self Care | Payer: Medicaid Other | Source: Ambulatory Visit | Attending: Obstetrics and Gynecology | Admitting: Obstetrics and Gynecology

## 2012-10-27 ENCOUNTER — Encounter (HOSPITAL_COMMUNITY): Payer: Self-pay | Admitting: Anesthesiology

## 2012-10-27 ENCOUNTER — Inpatient Hospital Stay (HOSPITAL_COMMUNITY): Payer: Medicaid Other | Admitting: Anesthesiology

## 2012-10-27 DIAGNOSIS — Z22330 Carrier of Group B streptococcus: Secondary | ICD-10-CM

## 2012-10-27 DIAGNOSIS — Z302 Encounter for sterilization: Secondary | ICD-10-CM

## 2012-10-27 DIAGNOSIS — O99892 Other specified diseases and conditions complicating childbirth: Secondary | ICD-10-CM | POA: Diagnosis present

## 2012-10-27 DIAGNOSIS — O9902 Anemia complicating childbirth: Secondary | ICD-10-CM | POA: Diagnosis present

## 2012-10-27 DIAGNOSIS — D649 Anemia, unspecified: Secondary | ICD-10-CM | POA: Diagnosis present

## 2012-10-27 LAB — CBC
Hemoglobin: 11.2 g/dL — ABNORMAL LOW (ref 12.0–15.0)
MCH: 28.4 pg (ref 26.0–34.0)
MCHC: 32.6 g/dL (ref 30.0–36.0)
Platelets: 278 10*3/uL (ref 150–400)

## 2012-10-27 MED ORDER — OXYTOCIN 40 UNITS IN LACTATED RINGERS INFUSION - SIMPLE MED: 62.5000 mL/h | INTRAVENOUS | Status: DC

## 2012-10-27 MED ORDER — OXYTOCIN BOLUS FROM INFUSION
500.0000 mL | INTRAVENOUS | Status: DC
  Administered 2012-10-27: 500 mL via INTRAVENOUS

## 2012-10-27 MED ORDER — LIDOCAINE HCL (PF) 1 % IJ SOLN
INTRAMUSCULAR | Status: DC | PRN
  Administered 2012-10-27 (×4): 4 mL

## 2012-10-27 MED ORDER — PENICILLIN G POTASSIUM 5000000 UNITS IJ SOLR
5.0000 10*6.[IU] | Freq: Once | INTRAVENOUS | Status: AC
  Administered 2012-10-27: 5 10*6.[IU] via INTRAVENOUS
  Filled 2012-10-27: qty 5

## 2012-10-27 MED ORDER — LACTATED RINGERS IV SOLN: 500.0000 mL | Freq: Once | INTRAVENOUS | Status: DC

## 2012-10-27 MED ORDER — LIDOCAINE HCL (PF) 1 % IJ SOLN
30.0000 mL | INTRAMUSCULAR | Status: DC | PRN
  Filled 2012-10-27 (×2): qty 30

## 2012-10-27 MED ORDER — FENTANYL 2.5 MCG/ML BUPIVACAINE 1/10 % EPIDURAL INFUSION (WH - ANES)
14.0000 mL/h | INTRAMUSCULAR | Status: DC | PRN
  Administered 2012-10-27: 14 mL/h via EPIDURAL
  Filled 2012-10-27: qty 125

## 2012-10-27 MED ORDER — ACETAMINOPHEN 325 MG PO TABS: 650.0000 mg | ORAL_TABLET | ORAL | Status: DC | PRN

## 2012-10-27 MED ORDER — CITRIC ACID-SODIUM CITRATE 334-500 MG/5ML PO SOLN: 30.0000 mL | ORAL | Status: DC | PRN

## 2012-10-27 MED ORDER — LACTATED RINGERS IV SOLN
INTRAVENOUS | Status: DC
  Administered 2012-10-27: 16:00:00 via INTRAVENOUS

## 2012-10-27 MED ORDER — PHENYLEPHRINE 40 MCG/ML (10ML) SYRINGE FOR IV PUSH (FOR BLOOD PRESSURE SUPPORT)
80.0000 ug | PREFILLED_SYRINGE | INTRAVENOUS | Status: DC | PRN
  Filled 2012-10-27: qty 5
  Filled 2012-10-27: qty 2

## 2012-10-27 MED ORDER — DIPHENHYDRAMINE HCL 50 MG/ML IJ SOLN: 12.5000 mg | INTRAMUSCULAR | Status: DC | PRN

## 2012-10-27 MED ORDER — OXYTOCIN 40 UNITS IN LACTATED RINGERS INFUSION - SIMPLE MED
1.0000 m[IU]/min | INTRAVENOUS | Status: DC
  Administered 2012-10-27: 2 m[IU]/min via INTRAVENOUS
  Filled 2012-10-27: qty 1000

## 2012-10-27 MED ORDER — ONDANSETRON HCL 4 MG/2ML IJ SOLN: 4.0000 mg | Freq: Four times a day (QID) | INTRAMUSCULAR | Status: DC | PRN

## 2012-10-27 MED ORDER — IBUPROFEN 600 MG PO TABS
600.0000 mg | ORAL_TABLET | Freq: Four times a day (QID) | ORAL | Status: DC | PRN
Start: 2012-10-27 — End: 2012-10-28

## 2012-10-27 MED ORDER — OXYCODONE-ACETAMINOPHEN 5-325 MG PO TABS: 1.0000 | ORAL_TABLET | ORAL | Status: DC | PRN

## 2012-10-27 MED ORDER — PENICILLIN G POTASSIUM 5000000 UNITS IJ SOLR
2.5000 10*6.[IU] | INTRAVENOUS | Status: DC
  Administered 2012-10-27: 2.5 10*6.[IU] via INTRAVENOUS
  Filled 2012-10-27 (×6): qty 2.5

## 2012-10-27 MED ORDER — TERBUTALINE SULFATE 1 MG/ML IJ SOLN: 0.2500 mg | Freq: Once | INTRAMUSCULAR | Status: AC | PRN

## 2012-10-27 MED ORDER — PHENYLEPHRINE 40 MCG/ML (10ML) SYRINGE FOR IV PUSH (FOR BLOOD PRESSURE SUPPORT)
80.0000 ug | PREFILLED_SYRINGE | INTRAVENOUS | Status: DC | PRN
  Filled 2012-10-27: qty 2

## 2012-10-27 MED ORDER — LACTATED RINGERS IV SOLN: 500.0000 mL | INTRAVENOUS | Status: DC | PRN

## 2012-10-27 MED ORDER — EPHEDRINE 5 MG/ML INJ
10.0000 mg | INTRAVENOUS | Status: DC | PRN
  Filled 2012-10-27: qty 2

## 2012-10-27 MED ORDER — EPHEDRINE 5 MG/ML INJ
10.0000 mg | INTRAVENOUS | Status: DC | PRN
  Filled 2012-10-27: qty 4
  Filled 2012-10-27: qty 2

## 2012-10-27 NOTE — Anesthesia Procedure Notes (Signed)
Epidural Patient location during procedure: OB Start time: 10/27/2012 4:49 PM  Staffing Performed by: anesthesiologist   Preanesthetic Checklist Completed: patient identified, site marked, surgical consent, pre-op evaluation, timeout performed, IV checked, risks and benefits discussed and monitors and equipment checked  Epidural Patient position: sitting Prep: site prepped and draped and DuraPrep Patient monitoring: continuous pulse ox and blood pressure Approach: midline Injection technique: LOR air  Needle:  Needle type: Tuohy  Needle gauge: 17 G Needle length: 9 cm and 9 Needle insertion depth: 6 cm Catheter type: closed end flexible Catheter size: 19 Gauge Catheter at skin depth: 11 cm Test dose: negative  Assessment Events: blood not aspirated, injection not painful, no injection resistance, negative IV test and no paresthesia  Additional Notes Discussed risk of headache, infection, bleeding, nerve injury and failed or incomplete block.  Patient voices understanding and wishes to proceed.  Epidural placed easily on first attempt.  No paresthesia.  Patient tolerated procedure well with no apparent complications.  Jasmine December, MDReason for block:procedure for pain

## 2012-10-27 NOTE — MAU Note (Signed)
Pt states she was standing at her aunts funeral and noticed a gush of fluid and thought it was her mucus plug but it continued to leak out clear fluid. Denies any contractions or bleeding. States baby is active

## 2012-10-27 NOTE — MAU Note (Signed)
Pt has fluid dripping on her legs and on the floor when she walked from bathroom to room 5.

## 2012-10-27 NOTE — Anesthesia Preprocedure Evaluation (Signed)

## 2012-10-27 NOTE — H&P (Signed)
Karla [name] is a 32 y.o. female G3P2002 at 38+ weeks (EDD 11/04/12 by early [place] at 10 weeks)  presenting for SROM this PM at 200pm. She has begun to contract on her own after ROM.  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  Pt also chronically anemic and required iron infusions to manage.   She desires pp sterilization and has signed papers for this.  Maternal Medical History:  Reason for admission: Rupture of membranes.   Contractions: Onset was 3-5 hours ago.   Frequency: regular.   Perceived severity is moderate.    Fetal activity: Perceived fetal activity is normal.    Prenatal Complications - Diabetes: none.    OB History   Grav Para Term Preterm Abortions TAB SAB Ect Mult Living   3 2 2       2     NSVD 2002 6# NSVD 5#3oz 5#3oz  Past Medical History  Diagnosis Date  . Anemia     since 32 y.o.    . GERD (gastroesophageal reflux disease)   . Abnormal Pap smear   . Herpes    History reviewed. No pertinent past surgical history. Family History: family history includes Anemia in her cousin and paternal grandmother; Cancer in her paternal grandfather; Diabetes in her father, maternal grandfather, and maternal grandmother; and Hypertension in her maternal grandmother. Social History:  reports that she has never smoked. She has never used smokeless tobacco. She reports that she does not drink alcohol or use illicit drugs.   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 rate 18, height 5\' 8"  (1.727 m), weight 97.977 kg (216 lb), last menstrual period 02/04/2012, SpO2 100.00%, unknown if currently breastfeeding. Maternal Exam:  Uterine Assessment: Contraction strength is moderate.  Contraction frequency is regular.   Abdomen: Patient reports no abdominal tenderness. Fetal presentation: vertex  Introitus: Normal vulva. Normal vagina.    Physical Exam  Constitutional: She is oriented to person, place, and time. She appears well-developed and well-nourished.  Cardiovascular: Normal rate and regular rhythm.   Respiratory: Effort normal and breath sounds normal.  GI: Soft. Bowel sounds are normal.  Genitourinary: Vagina normal and uterus normal.  Musculoskeletal: Normal range of motion.  Neurological: She is alert and oriented to person, place, and time.  Psychiatric: She has a normal mood and affect. Her behavior is normal.    Prenatal labs: ABO, Rh: B/Positive/-- (11/20 0000) Antibody:  negative Rubella: Immune (11/20 0000) RPR: Nonreactive (11/20 0000)  HBsAg: Negative (11/20 0000)  HIV: Non-reactive (11/20 0000)  GBS: Positive (11/20 0000)  One hour GTT 129 Hgb AA Quad screen negative  Assessment/Plan: Pt with SROM and painful contractions, just recveived epidural.d/w pt pp BTL and she desires to proceed.  Oliver Pila 10/27/2012, 5:07 PM

## 2012-10-28 ENCOUNTER — Inpatient Hospital Stay (HOSPITAL_COMMUNITY): Payer: Medicaid Other | Admitting: Anesthesiology

## 2012-10-28 ENCOUNTER — Encounter (HOSPITAL_COMMUNITY): Payer: Self-pay | Admitting: Anesthesiology

## 2012-10-28 ENCOUNTER — Encounter (HOSPITAL_COMMUNITY)
Admission: AD | Disposition: A | Discharge status: Home / Self Care | Payer: Self-pay | Source: Ambulatory Visit | Attending: Obstetrics and Gynecology

## 2012-10-28 HISTORY — PX: TUBAL LIGATION: SHX77

## 2012-10-28 LAB — CBC
MCH: 29.1 pg (ref 26.0–34.0)
MCHC: 33.1 g/dL (ref 30.0–36.0)
MCV: 87.9 fL (ref 78.0–100.0)
Platelets: 228 10*3/uL (ref 150–400)
RBC: 3.54 MIL/uL — ABNORMAL LOW (ref 3.87–5.11)

## 2012-10-28 LAB — RPR: RPR Ser Ql: NONREACTIVE

## 2012-10-28 SURGERY — LIGATION, FALLOPIAN TUBE, POSTPARTUM
Anesthesia: Epidural | Site: Abdomen | Laterality: Bilateral | Wound class: Clean Contaminated

## 2012-10-28 MED ORDER — METOCLOPRAMIDE HCL 10 MG PO TABS
10.0000 mg | ORAL_TABLET | Freq: Once | ORAL | Status: AC
  Administered 2012-10-28: 10 mg via ORAL
  Filled 2012-10-28: qty 1

## 2012-10-28 MED ORDER — IBUPROFEN 600 MG PO TABS
600.0000 mg | ORAL_TABLET | Freq: Four times a day (QID) | ORAL | Status: DC
  Administered 2012-10-28 – 2012-10-29 (×5): 600 mg via ORAL
  Filled 2012-10-28 (×6): qty 1

## 2012-10-28 MED ORDER — FENTANYL CITRATE 0.05 MG/ML IJ SOLN
INTRAMUSCULAR | Status: AC
  Filled 2012-10-28: qty 2

## 2012-10-28 MED ORDER — FAMOTIDINE 20 MG PO TABS
40.0000 mg | ORAL_TABLET | Freq: Once | ORAL | Status: AC
  Administered 2012-10-28: 40 mg via ORAL
  Filled 2012-10-28: qty 2

## 2012-10-28 MED ORDER — FENTANYL CITRATE 0.05 MG/ML IJ SOLN
INTRAMUSCULAR | Status: DC | PRN
  Administered 2012-10-28 (×2): 50 ug via INTRAVENOUS

## 2012-10-28 MED ORDER — PROMETHAZINE HCL 25 MG/ML IJ SOLN: 6.2500 mg | INTRAMUSCULAR | Status: DC | PRN

## 2012-10-28 MED ORDER — MIDAZOLAM HCL 2 MG/2ML IJ SOLN
INTRAMUSCULAR | Status: AC
  Filled 2012-10-28: qty 2

## 2012-10-28 MED ORDER — MIDAZOLAM HCL 5 MG/5ML IJ SOLN
INTRAMUSCULAR | Status: DC | PRN
  Administered 2012-10-28: 2 mg via INTRAVENOUS

## 2012-10-28 MED ORDER — ONDANSETRON HCL 4 MG/2ML IJ SOLN
INTRAMUSCULAR | Status: DC | PRN
  Administered 2012-10-28: 4 mg via INTRAVENOUS

## 2012-10-28 MED ORDER — ONDANSETRON HCL 4 MG/2ML IJ SOLN: 4.0000 mg | Freq: Four times a day (QID) | INTRAMUSCULAR | Status: DC | PRN

## 2012-10-28 MED ORDER — PROPOFOL 10 MG/ML IV BOLUS
INTRAVENOUS | Status: DC | PRN
  Administered 2012-10-28 (×3): 20 mg via INTRAVENOUS

## 2012-10-28 MED ORDER — DIBUCAINE 1 % RE OINT: 1.0000 "application " | TOPICAL_OINTMENT | RECTAL | Status: DC | PRN

## 2012-10-28 MED ORDER — LACTATED RINGERS IV SOLN
INTRAVENOUS | Status: DC
  Administered 2012-10-28: 20 mL/h via INTRAVENOUS

## 2012-10-28 MED ORDER — LIDOCAINE HCL (CARDIAC) 20 MG/ML IV SOLN
INTRAVENOUS | Status: DC | PRN
  Administered 2012-10-28: 40 mL via INTRAVENOUS

## 2012-10-28 MED ORDER — LACTATED RINGERS IV SOLN
INTRAVENOUS | Status: DC | PRN
  Administered 2012-10-28: 20:00:00 via INTRAVENOUS

## 2012-10-28 MED ORDER — HYDROMORPHONE HCL PF 1 MG/ML IJ SOLN: 0.2500 mg | INTRAMUSCULAR | Status: DC | PRN

## 2012-10-28 MED ORDER — ONDANSETRON HCL 4 MG PO TABS: 4.0000 mg | ORAL_TABLET | ORAL | Status: DC | PRN

## 2012-10-28 MED ORDER — OXYTOCIN 40 UNITS IN LACTATED RINGERS INFUSION - SIMPLE MED
INTRAVENOUS | Status: AC
  Filled 2012-10-28: qty 1000

## 2012-10-28 MED ORDER — LIDOCAINE-EPINEPHRINE (PF) 2 %-1:200000 IJ SOLN
INTRAMUSCULAR | Status: AC
  Filled 2012-10-28: qty 20

## 2012-10-28 MED ORDER — LACTATED RINGERS IV SOLN: INTRAVENOUS | Status: DC

## 2012-10-28 MED ORDER — SENNOSIDES-DOCUSATE SODIUM 8.6-50 MG PO TABS
2.0000 | ORAL_TABLET | Freq: Every day | ORAL | Status: DC
  Administered 2012-10-28: 2 via ORAL

## 2012-10-28 MED ORDER — KETOROLAC TROMETHAMINE 30 MG/ML IJ SOLN: 15.0000 mg | Freq: Once | INTRAMUSCULAR | Status: DC | PRN

## 2012-10-28 MED ORDER — LIDOCAINE HCL (CARDIAC) 20 MG/ML IV SOLN
INTRAVENOUS | Status: AC
  Filled 2012-10-28: qty 5

## 2012-10-28 MED ORDER — WITCH HAZEL-GLYCERIN EX PADS: 1.0000 "application " | MEDICATED_PAD | CUTANEOUS | Status: DC | PRN

## 2012-10-28 MED ORDER — SIMETHICONE 80 MG PO CHEW
80.0000 mg | CHEWABLE_TABLET | ORAL | Status: DC | PRN
  Administered 2012-10-28: 80 mg via ORAL

## 2012-10-28 MED ORDER — PRENATAL MULTIVITAMIN CH: 1.0000 | ORAL_TABLET | Freq: Every day | ORAL | Status: DC

## 2012-10-28 MED ORDER — METHYLERGONOVINE MALEATE 0.2 MG PO TABS
ORAL_TABLET | ORAL | Status: AC
  Filled 2012-10-28: qty 1

## 2012-10-28 MED ORDER — ONDANSETRON HCL 4 MG PO TABS: 4.0000 mg | ORAL_TABLET | Freq: Four times a day (QID) | ORAL | Status: DC | PRN

## 2012-10-28 MED ORDER — MEPERIDINE HCL 25 MG/ML IJ SOLN: 6.2500 mg | INTRAMUSCULAR | Status: DC | PRN

## 2012-10-28 MED ORDER — TETANUS-DIPHTH-ACELL PERTUSSIS 5-2.5-18.5 LF-MCG/0.5 IM SUSP: 0.5000 mL | Freq: Once | INTRAMUSCULAR | Status: DC

## 2012-10-28 MED ORDER — OXYCODONE-ACETAMINOPHEN 5-325 MG PO TABS
1.0000 | ORAL_TABLET | ORAL | Status: DC | PRN
  Administered 2012-10-28 – 2012-10-29 (×3): 2 via ORAL
  Filled 2012-10-28 (×3): qty 2

## 2012-10-28 MED ORDER — SODIUM BICARBONATE 8.4 % IV SOLN
INTRAVENOUS | Status: AC
  Filled 2012-10-28: qty 50

## 2012-10-28 MED ORDER — DIPHENHYDRAMINE HCL 25 MG PO CAPS: 25.0000 mg | ORAL_CAPSULE | Freq: Four times a day (QID) | ORAL | Status: DC | PRN

## 2012-10-28 MED ORDER — ZOLPIDEM TARTRATE 5 MG PO TABS: 5.0000 mg | ORAL_TABLET | Freq: Every evening | ORAL | Status: DC | PRN

## 2012-10-28 MED ORDER — OXYCODONE-ACETAMINOPHEN 5-325 MG PO TABS: 1.0000 | ORAL_TABLET | Freq: Four times a day (QID) | ORAL | Status: DC | PRN

## 2012-10-28 MED ORDER — ONDANSETRON HCL 4 MG/2ML IJ SOLN: 4.0000 mg | INTRAMUSCULAR | Status: DC | PRN

## 2012-10-28 MED ORDER — LACTATED RINGERS IV SOLN: INTRAVENOUS | Status: AC

## 2012-10-28 MED ORDER — LANOLIN HYDROUS EX OINT: TOPICAL_OINTMENT | CUTANEOUS | Status: DC | PRN

## 2012-10-28 MED ORDER — SODIUM BICARBONATE 8.4 % IV SOLN
INTRAVENOUS | Status: DC | PRN
  Administered 2012-10-28: 5 mL via EPIDURAL

## 2012-10-28 MED ORDER — PROPOFOL 10 MG/ML IV EMUL
INTRAVENOUS | Status: AC
  Filled 2012-10-28: qty 20

## 2012-10-28 MED ORDER — ONDANSETRON HCL 4 MG/2ML IJ SOLN
INTRAMUSCULAR | Status: AC
  Filled 2012-10-28: qty 2

## 2012-10-28 MED ORDER — BENZOCAINE-MENTHOL 20-0.5 % EX AERO: 1.0000 "application " | INHALATION_SPRAY | CUTANEOUS | Status: DC | PRN

## 2012-10-28 SURGICAL SUPPLY — 23 items
CLOTH BEACON ORANGE TIMEOUT ST (SAFETY) ×2 IMPLANT
CONTAINER PREFILL 10% NBF 15ML (MISCELLANEOUS) ×4 IMPLANT
DRSG COVADERM PLUS 2X2 (GAUZE/BANDAGES/DRESSINGS) ×1 IMPLANT
ELECT REM PT RETURN 9FT ADLT (ELECTROSURGICAL) ×2
ELECTRODE REM PT RTRN 9FT ADLT (ELECTROSURGICAL) ×1 IMPLANT
GLOVE BIO SURGEON STRL SZ7.5 (GLOVE) ×2 IMPLANT
GOWN PREVENTION PLUS LG XLONG (DISPOSABLE) ×2 IMPLANT
GOWN PREVENTION PLUS XLARGE (GOWN DISPOSABLE) ×2 IMPLANT
NS IRRIG 1000ML POUR BTL (IV SOLUTION) ×2 IMPLANT
PACK ABDOMINAL MINOR (CUSTOM PROCEDURE TRAY) ×2 IMPLANT
PENCIL BUTTON HOLSTER BLD 10FT (ELECTRODE) ×2 IMPLANT
SPONGE LAP 4X18 X RAY DECT (DISPOSABLE) IMPLANT
SUT PDS AB 1 CT1 36 (SUTURE) ×2 IMPLANT
SUT PLAIN 0 NONE (SUTURE) ×4 IMPLANT
SUT PLAIN 2 0 (SUTURE)
SUT PLAIN 3 0 X 1 18 (SUTURE) ×2 IMPLANT
SUT PLAIN ABS 2-0 54XMFL TIE (SUTURE) IMPLANT
SUT VIC AB 0 CT1 27 (SUTURE) ×2
SUT VIC AB 0 CT1 27XBRD ANBCTR (SUTURE) ×1 IMPLANT
SUT VIC AB 3-0 CTX 36 (SUTURE) ×1 IMPLANT
TOWEL OR 17X24 6PK STRL BLUE (TOWEL DISPOSABLE) ×4 IMPLANT
TRAY FOLEY CATH 14FR (SET/KITS/TRAYS/PACK) ×2 IMPLANT
WATER STERILE IRR 1000ML POUR (IV SOLUTION) ×1 IMPLANT

## 2012-10-28 NOTE — Op Note (Signed)
Operative note on Karla [name]:  Date of the operation: 10/28/2012  Preoperative diagnosis: Sterilization  Postoperative diagnosis: Same  Operation: Tubal ligation  Operator: Tayt Moyers  Anesthesia: Epidural  The patient was brought to the operating room and her epidural anesthetic was boosted. After anesthesia was effective, the abdomen was prepped with Betadine solution, the urethra was prepped with Betadine solution, and a Foley catheter was inserted to straight drain. A timeout was done and the abdomen was draped as a sterile field. Anesthesia was confirmed and a semilunar incision was made in the inferior portion of the umbilicus. The skin incision was then carried down through the subcutaneous tissue to the fascia, the fascia was incised, and the peritoneum was entered. With the help of a sponge the right he was identified and traced to its fimbriated end. A window was made in the mesosalpinx and 2 ties of 0 plain catgut were placed proximally and distallyon the tube and the intervening segment of tissue was excised. Palpation confirmed that the tube was present and the same procedure was repeated on the left side. The sponge was removed and the fascia was closed with 3 interrupted figure-of-eight sutures of 0 Vicryl. The subcutaneous tissue was closed with 3-0 Vicryl and the skin was reapproximated with 30 plain catgut. Blood loss was minimal and then 10 cc sponge and needle counts were correct and the patient was returned to recovery in satisfactory condition

## 2012-10-28 NOTE — Anesthesia Postprocedure Evaluation (Signed)
  Anesthesia Post-op Note  Patient: Karla [name]  Procedure(s) Performed: * No procedures listed *  Patient Location: PACU and Mother/Baby  Anesthesia Type:Epidural  Level of Consciousness: awake, alert  and oriented  Airway and Oxygen Therapy: Patient Spontanous Breathing  Post-op Pain: none  Post-op Assessment: Post-op Vital signs reviewed, Patient's Cardiovascular Status Stable, No headache, No backache, No residual numbness and No residual motor weakness  Post-op Vital Signs: Reviewed and stable  Complications: No apparent anesthesia complications

## 2012-10-28 NOTE — Progress Notes (Signed)
Post Partum Day 1 Subjective: no complaints, up ad lib and tolerating PO  Will be NPO from now until tubal.  Objective: Blood pressure 127/77, pulse 80, temperature 98.1 F (36.7 C), temperature source Oral, resp. rate 18, height 5\' 8"  (1.727 m), weight 97.977 kg (216 lb), last menstrual period 02/04/2012, SpO2 100.00%, unknown if currently breastfeeding.  Physical Exam:  General: alert and cooperative Lochia: appropriate Uterine Fundus: firm    Recent Labs  10/27/12 1537 10/28/12 0655  HGB 11.2* 10.3*  HCT 34.4* 31.1*    Assessment/Plan: Plan for discharge tomorrow D/w pt tubal may be delayed this pm but she says she would like to wait and have it done Plans circumcision in the office   LOS: 1 day   Divante Kotch W 10/28/2012, 8:28 AM

## 2012-10-28 NOTE — Transfer of Care (Signed)
Immediate Anesthesia Transfer of Care Note  Patient: Karla [name]  Procedure(s) Performed: Procedure(s): POST PARTUM TUBAL LIGATION (Bilateral)  Patient Location: PACU  Anesthesia Type:Epidural  Level of Consciousness: awake, alert  and oriented  Airway & Oxygen Therapy: Patient Spontanous Breathing  Post-op Assessment: Report given to PACU RN and Post -op Vital signs reviewed and stable  Post vital signs: Reviewed and stable  Complications: No apparent anesthesia complications

## 2012-10-28 NOTE — Anesthesia Preprocedure Evaluation (Addendum)
Anesthesia Evaluation  Patient identified by MRN, date of birth, ID band Patient awake    Reviewed: Allergy & Precautions, H&P , NPO status , Patient's Chart, lab work & pertinent test results, reviewed documented beta blocker date and time   History of Anesthesia Complications Negative for: history of anesthetic complications  Airway Mallampati: I TM Distance: >3 FB Neck ROM: full    Dental  (+) Teeth Intact   Pulmonary neg pulmonary ROS,  breath sounds clear to auscultation        Cardiovascular negative cardio ROS  Rhythm:regular Rate:Normal     Neuro/Psych negative neurological ROS  negative psych ROS   GI/Hepatic negative GI ROS, Neg liver ROS,   Endo/Other  negative endocrine ROS  Renal/GU negative Renal ROS     Musculoskeletal   Abdominal   Peds  Hematology  (+) anemia ,   Anesthesia Other Findings   Reproductive/Obstetrics (+) Pregnancy (s/p SVD 5/28)                           Anesthesia Physical  Anesthesia Plan  ASA: II  Anesthesia Plan: Epidural   Post-op Pain Management:    Induction:   Airway Management Planned:   Additional Equipment:   Intra-op Plan:   Post-operative Plan:   Informed Consent: I have reviewed the patients History and Physical, chart, labs and discussed the procedure including the risks, benefits and alternatives for the proposed anesthesia with the patient or authorized representative who has indicated his/her understanding and acceptance.     Plan Discussed with: CRNA and Surgeon  Anesthesia Plan Comments: (For PPTL)       Anesthesia Quick Evaluation

## 2012-10-28 NOTE — Anesthesia Postprocedure Evaluation (Signed)
Anesthesia Post Note  Patient: Karla [name]  Procedure(s) Performed: Procedure(s) (LRB): POST PARTUM TUBAL LIGATION (Bilateral)  Anesthesia type: Epidural  Patient location: PACU  Post pain: Pain level controlled  Post assessment: Post-op Vital signs reviewed  Last Vitals:  Filed Vitals:   10/28/12 2030  BP: 107/61  Pulse: 82  Temp: 36.6 C  Resp: 22    Post vital signs: Reviewed  Level of consciousness: awake  Complications: No apparent anesthesia complications

## 2012-10-28 NOTE — Progress Notes (Signed)
UR chart review completed.  

## 2012-10-29 ENCOUNTER — Encounter (HOSPITAL_COMMUNITY): Payer: Self-pay | Admitting: Obstetrics and Gynecology

## 2012-10-29 LAB — CBC WITH DIFFERENTIAL/PLATELET
Basophils Absolute: 0 10*3/uL (ref 0.0–0.1)
HCT: 29.1 % — ABNORMAL LOW (ref 36.0–46.0)
Hemoglobin: 9.6 g/dL — ABNORMAL LOW (ref 12.0–15.0)
Lymphocytes Relative: 15 % (ref 12–46)
Monocytes Absolute: 0.5 10*3/uL (ref 0.1–1.0)
Monocytes Relative: 5 % (ref 3–12)
Neutro Abs: 9.4 10*3/uL — ABNORMAL HIGH (ref 1.7–7.7)
RBC: 3.32 MIL/uL — ABNORMAL LOW (ref 3.87–5.11)
RDW: 20.7 % — ABNORMAL HIGH (ref 11.5–15.5)
WBC: 11.6 10*3/uL — ABNORMAL HIGH (ref 4.0–10.5)

## 2012-10-29 MED ORDER — OXYCODONE-ACETAMINOPHEN 5-325 MG PO TABS: 1.0000 | ORAL_TABLET | Freq: Four times a day (QID) | ORAL | Status: DC | PRN

## 2012-10-29 MED ORDER — IBUPROFEN 600 MG PO TABS: 600.0000 mg | ORAL_TABLET | Freq: Four times a day (QID) | ORAL | Status: DC | PRN

## 2012-10-29 NOTE — Lactation Note (Signed)
This note was copied from the chart of Karla [name]. Lactation Consultation Note  Patient Name: Karla [name] RUEAV'W Date: 10/29/2012 Reason for consult: Follow-up assessment   Maternal Data    Feeding   LATCH Score/Interventions                      Lactation Tools Discussed/Used     Consult Status Consult Status: Complete  Mom reports that baby has been nursing well but she is not making enough milk and baby still is hungry. Has been giving formula. Encouraged to always BF first- both breasts  then give formula if baby is still hungry. Encouraged to give small amounts of formula so baby will continue to nurse frequently to promote a good milk supply. Reports that nipples are feeling better than yesterday. No questions at present. To call prn.  Pamelia Hoit 10/29/2012, 8:35 AM

## 2012-10-29 NOTE — Anesthesia Postprocedure Evaluation (Signed)
  Anesthesia Post-op Note  Patient: Karla [name]  Procedure(s) Performed: * No procedures listed *  Patient Location: Mother/Baby  Anesthesia Type:Epidural  Level of Consciousness: awake  Airway and Oxygen Therapy: Patient Spontanous Breathing  Post-op Pain: none  Post-op Assessment: Patient's Cardiovascular Status Stable, Respiratory Function Stable, Patent Airway, No signs of Nausea or vomiting, Adequate PO intake, Pain level controlled, No headache, No backache, No residual numbness and No residual motor weakness  Post-op Vital Signs: Reviewed and stable  Complications: No apparent anesthesia complications

## 2012-10-29 NOTE — Progress Notes (Signed)
Patient ID: Karla [name], female   DOB: 1980/08/28, 32 y.o.   MRN: [id number] #2 AFEBRILE NO COMPLAINTS READY FOR D/C

## 2012-10-29 NOTE — Discharge Summary (Signed)
[name], [name]                ACCOUNT NO.:  [id number]  MEDICAL RECORD NO.:  [id number]  LOCATION:  9108                          FACILITY:  [facility]  PHYSICIAN:  Malachi Pro. Ambrose Mantle, M.D. DATE OF BIRTH:  [birth year]  DATE OF ADMISSION:  10/27/2012 DATE OF DISCHARGE:  10/29/2012                              DISCHARGE SUMMARY   HISTORY OF PRESENT ILLNESS:  This is a 32 year old black female, para 2- 0-0-2, gravida 3, EDC November 04, 2012, presented with spontaneous rupture of membranes at 2:00 p.m. on the day of admission.  She began to contract on her own after the rupture of membranes.  Prenatal care complicated by positive group B strep and history of herpes simplex virus on suppression.  Patient was also chronically anemic and required iron infusions to manage.  She desired postpartum sterilization and signed papers.  On admission to the hospital, her blood group and type was B positive, negative antibody, rubella immune, RPR nonreactive, hepatitis B surface antigen negative, HIV negative, group B strep positive.  One-hour Glucola 129, hemoglobin AA.  Quad screen was negative.  The patient progressed to complete dilatation and pushed well at 10:24 p.m., a living female infant was delivered vaginally by Dr. Senaida Ores, Apgars 8 and 9 at 1 and 5 minutes.  Placenta was intact, cord 3 vessels with no complications.  First-degree laceration repaired with 3-0 Vicryl repeat.  Blood loss about 300 mL.  Patient requested tubal sterilization.  They were unable to do the procedure until the evening of the first postpartum day, was done under epidural anesthesia by Dr. Ambrose Mantle with no problems.  First postoperative and second postpartum days, the patient was afebrile doing well and was ready for discharge.  Initial hemoglobin 11.2, hematocrit 34.4, white count 13000, platelet count 278,000.  Followup hemoglobin 10.3 on the first postpartum day and on the second postpartum and first postop day,  the hemoglobin was 9.6, hematocrit 29.1.  RPR was nonreactive.  MRSA test was negative.  Staph aureus test was negative.  FINAL DIAGNOSES:  Intrauterine pregnancy, 38 plus weeks, delivered vertex, voluntary sterilization.  OPERATION:  Spontaneous delivery vertex, repair of first-degree laceration, bilateral tubal ligation.  FINAL CONDITION:  Improved.  INSTRUCTIONS:  Include our regular discharge instruction booklet as well as the after-visit summary.  She is to continue her iron pills twice daily and take Motrin 600 mg 30 tablets one every 6 hours as needed for pain and Percocet 5/325, 30 tablets, one every 6 hours as needed for pain.  She is asked to return to the office in 2 weeks for followup examination.     Malachi Pro. Ambrose Mantle, M.D.     TFH/MEDQ  D:  10/29/2012  T:  10/29/2012  Job:  [id number]

## 2012-10-29 NOTE — Discharge Summary (Signed)
Obstetric Discharge Summary Reason for Admission: rupture of membranes Prenatal Procedures: none Intrapartum Procedures: spontaneous vaginal delivery Postpartum Procedures: P.P. tubal ligation Complications-Operative and Postpartum: none HGB  Date Value Range Status  10/20/2012 10.5* 11.6 - 15.9 g/dL Final     Hemoglobin  Date Value Range Status  10/29/2012 9.6* 12.0 - 15.0 g/dL Final     HCT  Date Value Range Status  10/29/2012 29.1* 36.0 - 46.0 % Final  10/20/2012 31.7* 34.8 - 46.6 % Final    Physical Exam:  General: alert and cooperative Lochia: appropriate Uterine Fundus: firm   Discharge Diagnoses: Term Pregnancy-delivered  Discharge Information: Date: 10/29/2012 Activity: pelvic rest Diet: routine Medications: Ibuprofen and Percocet Condition: improved Instructions: refer to practice specific booklet Discharge to: home Follow-up Information   Follow up with Karla Plume, MD In 2 weeks.   Contact information:   [address], [address], [address] [place] [address] [phone]       Newborn Data: Live born female  Birth Weight: 8 lb 3.6 oz (3731 g) APGAR: 9, 9  Home with mother.  Karla [name] 10/29/2012, 8:38 AM

## 2012-11-02 ENCOUNTER — Inpatient Hospital Stay (HOSPITAL_COMMUNITY): Admission: RE | Admit: 2012-11-02 | Payer: Medicaid Other | Source: Ambulatory Visit

## 2012-11-03 ENCOUNTER — Other Ambulatory Visit: Payer: Medicaid Other

## 2012-11-11 ENCOUNTER — Telehealth: Payer: Self-pay | Admitting: Oncology

## 2012-11-17 ENCOUNTER — Other Ambulatory Visit: Payer: Medicaid Other

## 2012-12-10 ENCOUNTER — Ambulatory Visit: Payer: Medicaid Other | Admitting: Oncology

## 2012-12-10 ENCOUNTER — Other Ambulatory Visit: Payer: Medicaid Other | Admitting: Lab

## 2012-12-20 ENCOUNTER — Other Ambulatory Visit: Payer: Medicaid Other | Admitting: Lab

## 2012-12-20 ENCOUNTER — Ambulatory Visit: Payer: Medicaid Other | Admitting: Oncology

## 2014-01-30 ENCOUNTER — Ambulatory Visit: Payer: Medicaid Other | Admitting: Family Medicine

## 2014-02-16 ENCOUNTER — Emergency Department (HOSPITAL_COMMUNITY)
Admission: EM | Admit: 2014-02-16 | Discharge: 2014-02-16 | Disposition: A | Discharge status: Home / Self Care | Payer: No Typology Code available for payment source | Attending: Emergency Medicine | Admitting: Emergency Medicine

## 2014-02-16 ENCOUNTER — Encounter (HOSPITAL_COMMUNITY): Payer: Self-pay | Admitting: Emergency Medicine

## 2014-02-16 DIAGNOSIS — S79929A Unspecified injury of unspecified thigh, initial encounter: Secondary | ICD-10-CM | POA: Diagnosis not present

## 2014-02-16 DIAGNOSIS — R109 Unspecified abdominal pain: Secondary | ICD-10-CM

## 2014-02-16 DIAGNOSIS — Z8719 Personal history of other diseases of the digestive system: Secondary | ICD-10-CM | POA: Insufficient documentation

## 2014-02-16 DIAGNOSIS — S79919A Unspecified injury of unspecified hip, initial encounter: Secondary | ICD-10-CM | POA: Insufficient documentation

## 2014-02-16 DIAGNOSIS — S3981XA Other specified injuries of abdomen, initial encounter: Secondary | ICD-10-CM | POA: Diagnosis not present

## 2014-02-16 DIAGNOSIS — M79602 Pain in left arm: Secondary | ICD-10-CM

## 2014-02-16 DIAGNOSIS — S59909A Unspecified injury of unspecified elbow, initial encounter: Secondary | ICD-10-CM | POA: Diagnosis not present

## 2014-02-16 DIAGNOSIS — Z862 Personal history of diseases of the blood and blood-forming organs and certain disorders involving the immune mechanism: Secondary | ICD-10-CM | POA: Insufficient documentation

## 2014-02-16 DIAGNOSIS — M25551 Pain in right hip: Secondary | ICD-10-CM

## 2014-02-16 DIAGNOSIS — S59919A Unspecified injury of unspecified forearm, initial encounter: Secondary | ICD-10-CM

## 2014-02-16 DIAGNOSIS — Z8619 Personal history of other infectious and parasitic diseases: Secondary | ICD-10-CM | POA: Insufficient documentation

## 2014-02-16 DIAGNOSIS — S6990XA Unspecified injury of unspecified wrist, hand and finger(s), initial encounter: Secondary | ICD-10-CM

## 2014-02-16 DIAGNOSIS — Y9289 Other specified places as the place of occurrence of the external cause: Secondary | ICD-10-CM | POA: Diagnosis not present

## 2014-02-16 DIAGNOSIS — Y9389 Activity, other specified: Secondary | ICD-10-CM | POA: Insufficient documentation
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
## 2014-02-16 NOTE — Discharge Instructions (Signed)
Return to the emergency room with worsening of symptoms, new symptoms or with symptoms that are concerning, especially fevers, worsening pain, nausea, vomiting, unable to keep anything down.  RICE: Rest, Ice (three cycles of 20 mins on, off at least twice a day), compression/brace, elevation. Heating pad works well for back pain. Ibuprofen  (3 tablets ) every 5-6 hours for 3-5 days and then as needed for pain. Follow up with PCP if symptoms worsen or are persistent.
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
## 2014-04-03 ENCOUNTER — Encounter (HOSPITAL_COMMUNITY): Payer: Self-pay | Admitting: Emergency Medicine

## 2015-01-29 ENCOUNTER — Encounter (HOSPITAL_COMMUNITY): Payer: Self-pay

## 2015-01-29 ENCOUNTER — Observation Stay (HOSPITAL_COMMUNITY)
Admission: EM | Admit: 2015-01-29 | Discharge: 2015-01-30 | Disposition: A | Discharge status: Home / Self Care | Payer: BC Managed Care – PPO | Attending: Internal Medicine | Admitting: Internal Medicine

## 2015-01-29 DIAGNOSIS — Z8619 Personal history of other infectious and parasitic diseases: Secondary | ICD-10-CM | POA: Insufficient documentation

## 2015-01-29 DIAGNOSIS — D509 Iron deficiency anemia, unspecified: Secondary | ICD-10-CM | POA: Diagnosis not present

## 2015-01-29 DIAGNOSIS — H1133 Conjunctival hemorrhage, bilateral: Secondary | ICD-10-CM | POA: Diagnosis not present

## 2015-01-29 DIAGNOSIS — K219 Gastro-esophageal reflux disease without esophagitis: Secondary | ICD-10-CM | POA: Insufficient documentation

## 2015-01-29 DIAGNOSIS — D649 Anemia, unspecified: Principal | ICD-10-CM | POA: Diagnosis present

## 2015-01-29 DIAGNOSIS — D6489 Other specified anemias: Secondary | ICD-10-CM

## 2015-01-29 DIAGNOSIS — R531 Weakness: Secondary | ICD-10-CM | POA: Diagnosis present

## 2015-01-29 HISTORY — DX: Personal history of other medical treatment: Z92.89

## 2015-01-29 LAB — BASIC METABOLIC PANEL
Anion gap: 7 (ref 5–15)
BUN: 12 mg/dL (ref 6–20)
CO2: 25 mmol/L (ref 22–32)
CREATININE: 0.76 mg/dL (ref 0.44–1.00)
Calcium: 8.7 mg/dL — ABNORMAL LOW (ref 8.9–10.3)
Chloride: 110 mmol/L (ref 101–111)
GFR calc Af Amer: >60 mL/min (ref >60)
GFR calc non Af Amer: >60 mL/min (ref >60)
GLUCOSE: 97 mg/dL (ref 65–99)
POTASSIUM: 3.7 mmol/L (ref 3.5–5.1)
SODIUM: 142 mmol/L (ref 135–145)

## 2015-01-29 LAB — PREPARE RBC (CROSSMATCH)

## 2015-01-29 LAB — RETICULOCYTES
RBC.: 4.49 MIL/uL (ref 3.87–5.11)
RETIC CT PCT: 0.5 % (ref 0.4–3.1)
Retic Count, Absolute: 22.5 10*3/uL (ref 19.0–186.0)

## 2015-01-29 LAB — HEPATIC FUNCTION PANEL
ALBUMIN: 3.8 g/dL (ref 3.5–5.0)
ALK PHOS: 75 U/L (ref 38–126)
ALT: 9 U/L — AB (ref 14–54)
AST: 20 U/L (ref 15–41)
BILIRUBIN TOTAL: 0.6 mg/dL (ref 0.3–1.2)
Bilirubin, Direct: <0.1 mg/dL — ABNORMAL LOW (ref 0.1–0.5)
TOTAL PROTEIN: 7.4 g/dL (ref 6.5–8.1)

## 2015-01-29 LAB — CBC WITH DIFFERENTIAL/PLATELET
BASOS PCT: 0 % (ref 0–1)
Basophils Absolute: 0 10*3/uL (ref 0.0–0.1)
EOS ABS: 0 10*3/uL (ref 0.0–0.7)
EOS PCT: 0 % (ref 0–5)
HCT: 26.8 % — ABNORMAL LOW (ref 36.0–46.0)
Hemoglobin: 7.3 g/dL — ABNORMAL LOW (ref 12.0–15.0)
LYMPHS ABS: 1.9 10*3/uL (ref 0.7–4.0)
Lymphocytes Relative: 31 % (ref 12–46)
MCH: 18.8 pg — AB (ref 26.0–34.0)
MCHC: 27.2 g/dL — AB (ref 30.0–36.0)
MCV: 69.1 fL — AB (ref 78.0–100.0)
MONO ABS: 0.4 10*3/uL (ref 0.1–1.0)
Monocytes Relative: 7 % (ref 3–12)
NEUTROS ABS: 3.7 10*3/uL (ref 1.7–7.7)
Neutrophils Relative %: 62 % (ref 43–77)
PLATELETS: 262 10*3/uL (ref 150–400)
RBC: 3.88 MIL/uL (ref 3.87–5.11)
RDW: 19.6 % — AB (ref 11.5–15.5)
WBC: 6 10*3/uL (ref 4.0–10.5)

## 2015-01-29 LAB — IRON AND TIBC
IRON: 61 ug/dL (ref 28–170)
SATURATION RATIOS: 14 % (ref 10.4–31.8)
TIBC: 423 ug/dL (ref 250–450)
UIBC: 362 ug/dL

## 2015-01-29 LAB — OCCULT BLOOD X 1 CARD TO LAB, STOOL: Fecal Occult Bld: NEGATIVE

## 2015-01-29 LAB — VITAMIN B12: VITAMIN B 12: 196 pg/mL (ref 180–914)

## 2015-01-29 LAB — TECHNOLOGIST SMEAR REVIEW: Tech Review: NONE SEEN

## 2015-01-29 LAB — FERRITIN: Ferritin: 5 ng/mL — ABNORMAL LOW (ref 11–307)

## 2015-01-29 LAB — SAVE SMEAR

## 2015-01-29 LAB — LACTATE DEHYDROGENASE: LDH: 146 U/L (ref 98–192)

## 2015-01-29 LAB — FOLATE: FOLATE: 15.1 ng/mL (ref >5.9)

## 2015-01-29 MED ORDER — FERROUS SULFATE 325 (65 FE) MG PO TABS
325.0000 mg | ORAL_TABLET | Freq: Every day | ORAL | Status: DC
  Administered 2015-01-30: 325 mg via ORAL
  Filled 2015-01-29 (×3): qty 1

## 2015-01-29 MED ORDER — ONDANSETRON HCL 4 MG PO TABS: 4.0000 mg | ORAL_TABLET | Freq: Four times a day (QID) | ORAL | Status: DC | PRN

## 2015-01-29 MED ORDER — SODIUM CHLORIDE 0.9 % IV SOLN
Freq: Once | INTRAVENOUS | Status: AC
  Administered 2015-01-29: 11:00:00 via INTRAVENOUS

## 2015-01-29 MED ORDER — SODIUM CHLORIDE 0.9 % IV SOLN
INTRAVENOUS | Status: DC
  Administered 2015-01-29: 08:00:00 via INTRAVENOUS

## 2015-01-29 MED ORDER — ALUM & MAG HYDROXIDE-SIMETH 200-200-20 MG/5ML PO SUSP: 30.0000 mL | Freq: Four times a day (QID) | ORAL | Status: DC | PRN

## 2015-01-29 MED ORDER — ONDANSETRON HCL 4 MG/2ML IJ SOLN
4.0000 mg | Freq: Four times a day (QID) | INTRAMUSCULAR | Status: DC | PRN
Start: 2015-01-29 — End: 2015-01-30

## 2015-01-29 MED ORDER — POLYETHYLENE GLYCOL 3350 17 G PO PACK
17.0000 g | PACK | Freq: Every day | ORAL | Status: DC
  Administered 2015-01-29 – 2015-01-30 (×2): 17 g via ORAL
  Filled 2015-01-29 (×2): qty 1

## 2015-01-29 MED ORDER — SODIUM CHLORIDE 0.9 % IV SOLN: INTRAVENOUS | Status: DC

## 2015-01-29 MED ORDER — ACETAMINOPHEN 650 MG RE SUPP: 650.0000 mg | Freq: Four times a day (QID) | RECTAL | Status: DC | PRN

## 2015-01-29 MED ORDER — ACETAMINOPHEN 325 MG PO TABS: 650.0000 mg | ORAL_TABLET | Freq: Four times a day (QID) | ORAL | Status: DC | PRN

## 2015-01-29 NOTE — ED Provider Notes (Signed)
CSN: [id number]     Arrival date & time 01/29/15  0730 History   First MD Initiated Contact with Patient 01/29/15 [phone]     Chief Complaint  Patient presents with  . Weakness  . Dizziness  . Nasal Congestion     (Consider location/radiation/quality/duration/timing/severity/associated sxs/prior Treatment) HPI Comments: H/o anemia for unknown reason--highest hb is 10. Denies black or bloody stools, no heavy periods.no sob. Use iron tablets daily  Patient is a 34 y.o. female presenting with weakness and dizziness. The history is provided by the patient.  Weakness This is a recurrent problem. The current episode started more than 1 week ago. The problem occurs constantly. The problem has been rapidly worsening. Pertinent negatives include no shortness of breath. The symptoms are aggravated by standing. Nothing relieves the symptoms. She has tried nothing for the symptoms.  Dizziness Associated symptoms: weakness   Associated symptoms: no shortness of breath     Past Medical History  Diagnosis Date  . Anemia     since 34 y.o.    . GERD (gastroesophageal reflux disease)   . Abnormal Pap smear   . Herpes    Past Surgical History  Procedure Laterality Date  . Tubal ligation Bilateral 10/28/2012    Procedure: POST PARTUM TUBAL LIGATION;  Surgeon: Bing Plume, MD;  Location: [facility];  Service: Gynecology;  Laterality: Bilateral;   Family History  Problem Relation Age of Onset  . Diabetes Father   . Diabetes Maternal Grandmother   . Hypertension Maternal Grandmother   . Diabetes Maternal Grandfather   . Anemia Paternal Grandmother   . Cancer Paternal Grandfather     prostate  . Anemia Cousin    Social History  Substance Use Topics  . Smoking status: Never Smoker   . Smokeless tobacco: Never Used  . Alcohol Use: No     Comment: NOT WHILE PREG   OB History    Gravida Para Term Preterm AB TAB SAB Ectopic Multiple Living   3 3 3       3      Review of Systems   Respiratory: Negative for shortness of breath.   Neurological: Positive for dizziness and weakness.  All other systems reviewed and are negative.     Allergies  Review of patient's allergies indicates no known allergies.  Home Medications   Prior to Admission medications   Not on File   BP 146/84 mmHg  Pulse 83  Temp(Src) 98.5 F (36.9 C) (Oral)  Resp 18  SpO2 100% Physical Exam  Constitutional: She is oriented to person, place, and time. She appears well-developed and well-nourished.  Non-toxic appearance. No distress.  HENT:  Head: Normocephalic and atraumatic.  Eyes: Conjunctivae, EOM and lids are normal. Pupils are equal, round, and reactive to light.  Pale conjunctiva  Neck: Normal range of motion. Neck supple. No tracheal deviation present. No thyroid mass present.  Cardiovascular: Normal rate, regular rhythm and normal heart sounds.  Exam reveals no gallop.   No murmur heard. Pulmonary/Chest: Effort normal and breath sounds normal. No stridor. No respiratory distress. She has no decreased breath sounds. She has no wheezes. She has no rhonchi. She has no rales.  Abdominal: Soft. Normal appearance and bowel sounds are normal. She exhibits no distension. There is no tenderness. There is no rebound and no CVA tenderness.  Musculoskeletal: Normal range of motion. She exhibits no edema or tenderness.  Neurological: She is alert and oriented to person, place, and time. She has normal strength.  No cranial nerve deficit or sensory deficit. GCS eye subscore is 4. GCS verbal subscore is 5. GCS motor subscore is 6.  Skin: Skin is warm and dry. No abrasion and no rash noted.  Psychiatric: She has a normal mood and affect. Her speech is normal and behavior is normal.  Nursing note and vitals reviewed.   ED Course  Procedures (including critical care time) Labs Review Labs Reviewed  CBC WITH DIFFERENTIAL/PLATELET  BASIC METABOLIC PANEL  TYPE AND SCREEN    Imaging Review No  results found. I have personally reviewed and evaluated these images and lab results as part of my medical decision-making.   EKG Interpretation None      MDM   Final diagnoses:  None    Patient will be admitted for symptomatic anemia    Lorre Nick, MD 01/29/15 [phone]

## 2015-01-29 NOTE — ED Notes (Signed)
Patient c/o sinus congestion, weakness, and dizziness x 3 weeks.

## 2015-01-29 NOTE — H&P (Signed)
History and Physical:    Karla [name]   ZOX:[id number] DOB: 1981-01-23 DOA: 01/29/2015  Referring MD/provider: Dr. Cordelia Poche PCP: Efrain Sella   Chief Complaint: Weakness  History of Present Illness:   Karla [name] is an 34 y.o. female with a PMH of iron deficiency anemia requiring blood transfusion secondary menorrhagia status post uterine ablation done about 1 year ago with no menses since then, who presents with a several week history of progressive weakness and dizziness.  Symptoms are not associated with dyspnea or black/bloody stools.  She has never had a GI evaluation except for hemoccult check, which she thinks was negative.  ROS:   Review of Systems  Constitutional: Positive for chills and malaise/fatigue. Negative for fever, weight loss and diaphoresis.  HENT: Positive for congestion. Negative for ear discharge, ear pain, hearing loss, nosebleeds, sore throat and tinnitus.   Eyes: Positive for blurred vision. Negative for double vision, photophobia, pain, discharge and redness.  Respiratory: Positive for cough. Negative for shortness of breath and stridor.        Dry cough with some sinus congestion  Cardiovascular: Negative for chest pain and palpitations.  Gastrointestinal: Positive for constipation. Negative for heartburn, nausea, vomiting, abdominal pain, diarrhea, blood in stool and melena.  Genitourinary: Negative.   Musculoskeletal: Negative.   Skin: Positive for itching and rash.       Rash on neck, long-standing.  Neurological: Positive for dizziness and weakness. Negative for headaches.  Endo/Heme/Allergies: Negative for environmental allergies and polydipsia. Bruises/bleeds easily.  Psychiatric/Behavioral: Negative for depression. The patient is not nervous/anxious.     Past Medical History:   Past Medical History  Diagnosis Date  . Iron deficiency anemia     since 34 y.o.    . GERD (gastroesophageal reflux disease)   . Abnormal Pap  smear   . Herpes   . H/O transfusion of packed red blood cells     Past Surgical History:   Past Surgical History  Procedure Laterality Date  . Tubal ligation Bilateral 10/28/2012    Procedure: POST PARTUM TUBAL LIGATION;  Surgeon: Bing Plume, MD;  Location: [facility];  Service: Gynecology;  Laterality: Bilateral;  . Uterine ablation      Social History:   Social History   Social History  . Marital Status: Single    Spouse Name: N/A  . Number of Children: 2  . Years of Education: N/A   Occupational History  . 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 Smoking status: Never Smoker   . Smokeless tobacco: Never Used  . Alcohol Use: No     Comment: NOT WHILE PREG  . Drug Use: No  . Sexual Activity: Not Currently   Other Topics Concern  . Not on file   Social History Narrative   Married.  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    Family history:   Family History  Problem Relation Age of Onset  . Diabetes Father   . Diabetes Maternal Grandmother   . Hypertension Maternal Grandmother   . Diabetes Maternal Grandfather   . Anemia Paternal Grandmother   . Cancer Paternal Grandfather     prostate  . Anemia Cousin     Allergies   Review of patient's allergies indicates no known allergies.  Current Medications:   Prior to Admission medications   Medication Sig Start Date  End Date Taking? Authorizing Provider  ferrous sulfate 325 (65 FE) MG tablet Take 325 mg by mouth daily with breakfast.   Yes Historical Provider, MD    Physical Exam:   Filed Vitals:   01/29/15 0736 01/29/15 0949 01/29/15 1034 01/29/15 1118  BP: 146/84 130/61 131/71 135/75  Pulse: 83 65 73 76  Temp: 98.5 F (36.9 C) 98.2 F (36.8 C) 97.9 F (36.6 C) 98 F (36.7 C)  TempSrc: Oral Oral Oral Oral  Resp: Height:    (1.727 m)   Weight:   89.812 kg (198 lb)   SpO2: 100% 100%  100% 100%     Physical Exam: Blood pressure 135/75, pulse 76, temperature 98 F (36.7 C), temperature source Oral, resp. rate 18, height  (1.727 m), weight 89.812 kg (198 lb), SpO2 100 %, unknown if currently breastfeeding. Gen: No acute distress. Head: Normocephalic, atraumatic. Eyes: PERRL, EOMI, sclerae nonicteric. Mouth: Oropharynx clear with pale gums. Neck: Supple, no thyromegaly, no lymphadenopathy, no jugular venous distention. Chest: Lungs clear to auscultation bilaterally. CV: Heart sounds are regular. No murmurs, rubs, or gallops. Abdomen: Soft, nontender, nondistended with normal active bowel sounds. Extremities: Extremities are without clubbing, edema, or cyanosis. Skin: Warm and dry. Neuro: Alert and oriented times 3; grossly nonfocal. Psych: Mood and affect normal.   Data Review:    Labs: Basic Metabolic Panel:  Recent Labs Lab 01/29/15 0810  NA 142  K 3.7  CL 110  CO2 25  GLUCOSE 97  BUN 12  CREATININE 0.76  CALCIUM 8.7*   Liver Function Tests: No results for input(s): AST, ALT, ALKPHOS, BILITOT, PROT, ALBUMIN in the last 168 hours. No results for input(s): LIPASE, AMYLASE in the last 168 hours. No results for input(s): AMMONIA in the last 168 hours. CBC:  Recent Labs Lab 01/29/15 0810  WBC 6.0  NEUTROABS 3.7  HGB 7.3*  HCT 26.8*  MCV 69.1*  PLT 262    Radiographic Studies: No results found.   EKG: None done.   Assessment/Plan:   Principal Problem:   Symptomatic iron deficiency anemia - Has not had any menstrual periods for over a year status post uterine ablation. - Already on oral iron supplementation. - No reports of black or bloody stools. Heme check. - Check anemia panel, LDH, LFTs, and smear. - Transfuse 1 unit of packed red blood cells.    DVT prophylaxis - SCDs.  Code Status: Full. Family Communication: No family currently at the bedside. Disposition Plan: Should be stable for discharge and 01/30/15 if no  evidence of GI bleeding.  Time spent: 45 minutes.  Danaysia Rader [facility] Pager [phone] Cell: [phone]   If 7PM-7AM, please contact night-coverage [email or web address] Password [facility] 01/29/2015, 11:43 AM

## 2015-01-29 NOTE — ED Notes (Signed)
Pt has urine sample In room.

## 2015-01-30 DIAGNOSIS — D509 Iron deficiency anemia, unspecified: Secondary | ICD-10-CM | POA: Diagnosis not present

## 2015-01-30 DIAGNOSIS — D649 Anemia, unspecified: Secondary | ICD-10-CM | POA: Diagnosis not present

## 2015-01-30 LAB — TYPE AND SCREEN
ABO/RH(D): B POS
Antibody Screen: NEGATIVE
UNIT DIVISION: 0
Unit division: 0

## 2015-01-30 LAB — CBC
HCT: 31.9 % — ABNORMAL LOW (ref 36.0–46.0)
Hemoglobin: 9.4 g/dL — ABNORMAL LOW (ref 12.0–15.0)
MCH: 21.4 pg — ABNORMAL LOW (ref 26.0–34.0)
MCHC: 29.5 g/dL — ABNORMAL LOW (ref 30.0–36.0)
MCV: 72.5 fL — ABNORMAL LOW (ref 78.0–100.0)
PLATELETS: 209 10*3/uL (ref 150–400)
RBC: 4.4 MIL/uL (ref 3.87–5.11)
RDW: 20.6 % — AB (ref 11.5–15.5)
WBC: 6.2 10*3/uL (ref 4.0–10.5)

## 2015-01-30 LAB — ABO/RH: ABO/RH(D): B POS

## 2015-01-30 MED ORDER — FERROUS SULFATE 325 (65 FE) MG PO TABS: 325.0000 mg | ORAL_TABLET | Freq: Two times a day (BID) | ORAL | Status: DC

## 2015-01-30 MED ORDER — POLYETHYLENE GLYCOL 3350 17 G PO PACK: 17.0000 g | PACK | Freq: Every day | ORAL | Status: DC

## 2015-01-30 MED ORDER — SODIUM CHLORIDE 0.9 % IV SOLN
510.0000 mg | Freq: Once | INTRAVENOUS | Status: AC
  Administered 2015-01-30: 510 mg via INTRAVENOUS
  Filled 2015-01-30: qty 17

## 2015-01-30 NOTE — Progress Notes (Signed)
Discharge instructions explained to pt. Husband in to take pt. Home. Discharge via wheelchair. Instructed not to drive or return to work until dizziness resolved.

## 2015-01-30 NOTE — Plan of Care (Signed)
Problem: Discharge Progression Outcomes Goal: Activity appropriate for discharge plan Outcome: Adequate for Discharge Instructed not to drive until her dizziness is resolved. Also to return to work when her dizziness is resolved.

## 2015-01-30 NOTE — Plan of Care (Signed)
Problem: Phase I Progression Outcomes Goal: Other Phase I Outcomes/Goals Outcome: Completed/Met Date Met:  01/30/15 Hemoccult negative

## 2015-01-30 NOTE — Discharge Summary (Signed)
Physician Discharge Summary  [name]:[id number] DOB: July 11, 1980 DOA: 01/29/2015  PCP: Carollee Herter, MD  Admit date: 01/29/2015 Discharge date: 01/30/2015   Recommendations for Outpatient Follow-Up:   1. Patient will follow-up with her PCP on 02/20/15 for a repeat hemoglobin check.   Discharge Diagnosis:   Principal Problem:    Symptomatic iron deficiency anemia status post 1 unit of packed red blood cells  Discharge disposition:  Home.   Discharge Condition: Improved.  Diet recommendation: Regular.    History of Present Illness:   Karla [name] is an 34 y.o. female with a PMH of iron deficiency anemia requiring blood transfusion secondary menorrhagia status post uterine ablation done about 1 year ago with no menses since then, who presents with a several week history of progressive weakness and dizziness. Symptoms are not associated with dyspnea or black/bloody stools. She has never had a GI evaluation except for hemoccult check, which she thinks was negative.   Hospital Course by Problem:   Principal Problem:  Symptomatic iron deficiency anemia - Has not had any menstrual periods for over a year status post uterine ablation. - Fecal occult blood negative. No reports of black or bloody stools. - Already on oral iron supplementation (but was noncompliant with this secondary to iron causing constipation). - Anemia panel consistent with iron deficiency. Reticulocyte count low given degree of anemia. - No evidence of hemolysis with normal bilirubin and LDH. No schistocytes seen on smear. - Hemoglobin 9.7 mg/dL after 1 unit of PRBCs.given Feraheme prior to discharge. - Placed on MiraLAX and instructed to take an iron supplement twice a day with meals.  Medical Consultants:    None.   Discharge Exam:   Filed Vitals:   01/30/15 1120  BP: 140/81  Pulse: 69  Temp:   Resp:    Filed Vitals:   01/29/15 2005 01/29/15 2056 01/30/15 0557 01/30/15  1120  BP:  137/93 127/78 140/81  Pulse: 78 103 88 69  Temp:  98.2 F (36.8 C) 97.8 F (36.6 C)   TempSrc:  Oral Oral   Resp:  18 18   Height:      Weight:      SpO2:   100% 100%    Gen:  NAD Cardiovascular:  RRR, No M/R/G Respiratory: Lungs CTAB Gastrointestinal: Abdomen soft, NT/ND with normal active bowel sounds. Extremities: No C/E/C   The results of significant diagnostics from this hospitalization (including imaging, microbiology, ancillary and laboratory) are listed below for reference.     Procedures and Diagnostic Studies:   No results found.   Labs:   Basic Metabolic Panel:  Recent Labs Lab 01/29/15 0810  NA 142  K 3.7  CL 110  CO2 25  GLUCOSE 97  BUN 12  CREATININE 0.76  CALCIUM 8.7*   GFR Estimated Creatinine Clearance: 115.1 mL/min (by C-G formula based on Cr of 0.76). Liver Function Tests:  Recent Labs Lab 01/29/15 1815  AST 20  ALT 9*  ALKPHOS 75  BILITOT 0.6  PROT 7.4  ALBUMIN 3.8   CBC:  Recent Labs Lab 01/29/15 0810 01/30/15 0415  WBC 6.0 6.2  NEUTROABS 3.7  --   HGB 7.3* 9.4*  HCT 26.8* 31.9*  MCV 69.1* 72.5*  PLT 262 209   Fecal occult blood testing:  Ref. Range 01/29/2015 20:16  Fecal Occult Blood, POC Latest Ref Range: NEGATIVE  NEGATIVE    Recent Labs  01/29/15 1815  VITAMINB12 196  FOLATE 15.1  FERRITIN 5*  TIBC  423  IRON 61  RETICCTPCT 0.5   Microbiology No results found for this or any previous visit (from the past 240 hour(s)).   Discharge Instructions:   Discharge Instructions    Activity as tolerated - No restrictions    Complete by:  As directed      Call MD for:  extreme fatigue    Complete by:  As directed      Call MD for:  persistant dizziness or light-headedness    Complete by:  As directed      Diet Carb Modified    Complete by:  As directed      Driving Restrictions    Complete by:  As directed   No driving until dizziness resolved.     Other Restrictions    Complete by:  As  directed   May return to work when dizziness resolved.            Medication List    TAKE these medications        ferrous sulfate 325 (65 FE) MG tablet  Take 1 tablet (325 mg total) by mouth 2 (two) times daily with a meal.     polyethylene glycol packet  Commonly known as:  MIRALAX / GLYCOLAX  Take 17 g by mouth daily.           Follow-up Information    Follow up with Carollee Herter, MD.   Specialty:  Family Medicine   Why:  Pt has an appt on Tuesday Sept 20 at [facility] information:   [address] [place] [address] [phone]        Time coordinating discharge: 25 minutes.   Signed:  RAMA,CHRISTINA  Pager [phone] [facility] 01/30/2015, 3:14 PM

## 2015-01-30 NOTE — Discharge Instructions (Signed)
Iron Deficiency Anemia Anemia is a condition in which there are less red blood cells or hemoglobin in the blood than normal. Hemoglobin is the part of red blood cells that carries oxygen. Iron deficiency anemia is anemia caused by too little iron. It is the most common type of anemia. It may leave you tired and short of breath. CAUSES   Lack of iron in the diet.  Poor absorption of iron, as seen with intestinal disorders.  Intestinal bleeding.  Heavy periods. SIGNS AND SYMPTOMS  Mild anemia may not be noticeable. Symptoms may include:  Fatigue.  Headache.  Pale skin.  Weakness.  Tiredness.  Shortness of breath.  Dizziness.  Cold hands and feet.  Fast or irregular heartbeat. DIAGNOSIS  Diagnosis requires a thorough evaluation and physical exam by your health care provider. Blood tests are generally used to confirm iron deficiency anemia. Additional tests may be done to find the underlying cause of your anemia. These may include:  Testing for blood in the stool (fecal occult blood test).  A procedure to see inside the colon and rectum (colonoscopy).  A procedure to see inside the esophagus and stomach (endoscopy). TREATMENT  Iron deficiency anemia is treated by correcting the cause of the deficiency. Treatment may involve:  Adding iron-rich foods to your diet.  Taking iron supplements. Pregnant or breastfeeding women need to take extra iron because their normal diet usually does not provide the required amount.  Taking vitamins. Vitamin C improves the absorption of iron. Your health care provider may recommend that you take your iron tablets with a glass of orange juice or vitamin C supplement.  Medicines to make heavy menstrual flow lighter.  Surgery. HOME CARE INSTRUCTIONS   Take iron as directed by your health care provider.  If you cannot tolerate taking iron supplements by mouth, talk to your health care provider about taking them through a vein  (intravenously) or an injection into a muscle.  For the best iron absorption, iron supplements should be taken on an empty stomach. If you cannot tolerate them on an empty stomach, you may need to take them with food.  Do not drink milk or take antacids at the same time as your iron supplements. Milk and antacids may interfere with the absorption of iron.  Iron supplements can cause constipation. Make sure to include fiber in your diet to prevent constipation. A stool softener may also be recommended.  Take vitamins as directed by your health care provider.  Eat a diet rich in iron. Foods high in iron include liver, lean beef, whole-grain bread, eggs, dried fruit, and dark green leafy vegetables. SEEK IMMEDIATE MEDICAL CARE IF:   You faint. If this happens, do not drive. Call your local emergency services (911 in [place] if no other help is available.  You have chest pain.  You feel nauseous or vomit.  You have severe or increased shortness of breath with activity.  You feel weak.  You have a rapid heartbeat.  You have unexplained sweating.  You become light-headed when getting up from a chair or bed. MAKE SURE YOU:   Understand these instructions.  Will watch your condition.  Will get help right away if you are not doing well or get worse. Document Released: 05/16/2000 Document Revised: 05/24/2013 Document Reviewed: 01/24/2013 Exit[employer] Patient Information 2015 [employer]. This information is not intended to replace advice given to you by your health care provider. Make sure you discuss any questions you have with your health care provider.  

## 2015-02-04 ENCOUNTER — Emergency Department (HOSPITAL_COMMUNITY)
Admission: EM | Admit: 2015-02-04 | Discharge: 2015-02-04 | Disposition: A | Discharge status: Home / Self Care | Payer: BC Managed Care – PPO | Attending: Emergency Medicine | Admitting: Emergency Medicine

## 2015-02-04 DIAGNOSIS — R531 Weakness: Secondary | ICD-10-CM | POA: Diagnosis present

## 2015-02-04 DIAGNOSIS — Z8719 Personal history of other diseases of the digestive system: Secondary | ICD-10-CM | POA: Diagnosis not present

## 2015-02-04 DIAGNOSIS — Z79899 Other long term (current) drug therapy: Secondary | ICD-10-CM | POA: Insufficient documentation

## 2015-02-04 DIAGNOSIS — R000 Tachycardia, unspecified: Secondary | ICD-10-CM | POA: Insufficient documentation

## 2015-02-04 DIAGNOSIS — Z8619 Personal history of other infectious and parasitic diseases: Secondary | ICD-10-CM | POA: Diagnosis not present

## 2015-02-04 DIAGNOSIS — R200 Anesthesia of skin: Secondary | ICD-10-CM | POA: Insufficient documentation

## 2015-02-04 DIAGNOSIS — M791 Myalgia, unspecified site: Secondary | ICD-10-CM

## 2015-02-04 DIAGNOSIS — D649 Anemia, unspecified: Secondary | ICD-10-CM | POA: Diagnosis not present

## 2015-02-04 LAB — CBC WITH DIFFERENTIAL/PLATELET
Basophils Absolute: 0 10*3/uL (ref 0.0–0.1)
Basophils Relative: 0 % (ref 0–1)
EOS PCT: 0 % (ref 0–5)
Eosinophils Absolute: 0 10*3/uL (ref 0.0–0.7)
HEMATOCRIT: 36.8 % (ref 36.0–46.0)
Hemoglobin: 11.6 g/dL — ABNORMAL LOW (ref 12.0–15.0)
LYMPHS ABS: 1.9 10*3/uL (ref 0.7–4.0)
Lymphocytes Relative: 19 % (ref 12–46)
MCH: 23.1 pg — ABNORMAL LOW (ref 26.0–34.0)
MCHC: 31.5 g/dL (ref 30.0–36.0)
MCV: 73.3 fL — AB (ref 78.0–100.0)
MONOS PCT: 7 % (ref 3–12)
Monocytes Absolute: 0.7 10*3/uL (ref 0.1–1.0)
NEUTROS ABS: 7.2 10*3/uL (ref 1.7–7.7)
Neutrophils Relative %: 74 % (ref 43–77)
Platelets: 233 10*3/uL (ref 150–400)
RBC: 5.02 MIL/uL (ref 3.87–5.11)
RDW: 23.6 % — AB (ref 11.5–15.5)
WBC: 9.8 10*3/uL (ref 4.0–10.5)

## 2015-02-04 LAB — I-STAT CHEM 8, ED
BUN: 16 mg/dL (ref 6–20)
CREATININE: 0.7 mg/dL (ref 0.44–1.00)
Calcium, Ion: 1.16 mmol/L (ref 1.12–1.23)
Chloride: 107 mmol/L (ref 101–111)
GLUCOSE: 100 mg/dL — AB (ref 65–99)
HCT: 41 % (ref 36.0–46.0)
HEMOGLOBIN: 13.9 g/dL (ref 12.0–15.0)
POTASSIUM: 3.7 mmol/L (ref 3.5–5.1)
Sodium: 140 mmol/L (ref 135–145)
TCO2: 19 mmol/L (ref 0–100)

## 2015-02-04 MED ORDER — METHOCARBAMOL 500 MG PO TABS
750.0000 mg | ORAL_TABLET | Freq: Once | ORAL | Status: AC
  Administered 2015-02-04: 750 mg via ORAL
  Filled 2015-02-04: qty 2

## 2015-02-04 MED ORDER — METHOCARBAMOL 750 MG PO TABS: 750.0000 mg | ORAL_TABLET | Freq: Three times a day (TID) | ORAL | Status: AC

## 2015-02-04 MED ORDER — SODIUM CHLORIDE 0.9 % IV BOLUS (SEPSIS)
1000.0000 mL | Freq: Once | INTRAVENOUS | Status: AC
  Administered 2015-02-04: 1000 mL via INTRAVENOUS

## 2015-02-04 NOTE — ED Notes (Signed)
Pt c/o generalized weakness and pain that began on Friday.  She has been taking ibuprofen for pain with no relief.  No ShOB but dizzy with changes in position.

## 2015-02-04 NOTE — Discharge Instructions (Signed)
No definitive cause for your muscle pain has been identified, tonight he was given a dose of Robaxin in the emergency department with some relief.  Would like you to take this on a regular basis for the next 3 days and then as needed thereafter.  I would like you to make an appointment with your primary care physician for further evaluation.  Next week as well.  If any new symptoms present themselves such as a rash, fever, please return for further evaluation

## 2015-02-04 NOTE — ED Provider Notes (Signed)
CSN: [id number]     Arrival date & time 02/04/15  1605 History   First MD Initiated Contact with Patient 02/04/15 1631     Chief Complaint  Patient presents with  . Weakness     (Consider location/radiation/quality/duration/timing/severity/associated sxs/prior Treatment) HPI Comments: This is a [age]-year-old female with a history of anemia who received a blood transfusion Tuesday.  Since that time she's had some diffuse myalgia as crampy muscle pain and just a feeling of overall weakness.  She denies any fever, dysuria, nausea, vomiting, diarrhea, cough, URI symptoms, no change in appetite or bowel habits  Patient is a 34 y.o. female presenting with weakness. The history is provided by the patient.  Weakness This is a new problem. The current episode started in the past 7 days. The problem occurs constantly. The problem has been gradually worsening. Associated symptoms include myalgias, numbness and weakness. Pertinent negatives include no abdominal pain, chest pain, chills, congestion, fever, nausea or rash. Nothing aggravates the symptoms. She has tried nothing for the symptoms.    Past Medical History  Diagnosis Date  . Iron deficiency anemia     since 34 y.o.    . GERD (gastroesophageal reflux disease)   . Abnormal Pap smear   . Herpes   . H/O transfusion of packed red blood cells    Past Surgical History  Procedure Laterality Date  . Tubal ligation Bilateral 10/28/2012    Procedure: POST PARTUM TUBAL LIGATION;  Surgeon: Bing Plume, MD;  Location: [facility];  Service: Gynecology;  Laterality: Bilateral;  . Uterine ablation     Family History  Problem Relation Age of Onset  . Diabetes Father   . Diabetes Maternal Grandmother   . Hypertension Maternal Grandmother   . Diabetes Maternal Grandfather   . Anemia Paternal Grandmother   . Cancer Paternal Grandfather     prostate  . Anemia Cousin    Social History  Substance Use Topics  . Smoking status: Never Smoker   .  Smokeless tobacco: Never Used  . Alcohol Use: No     Comment: NOT WHILE PREG   OB History    Gravida Para Term Preterm AB TAB SAB Ectopic Multiple Living   Review of Systems  Constitutional: Negative for fever and chills.  HENT: Negative for congestion and trouble swallowing.   Respiratory: Negative for shortness of breath.   Cardiovascular: Negative for chest pain and palpitations.  Gastrointestinal: Negative for nausea, abdominal pain, diarrhea and constipation.  Genitourinary: Negative for dysuria.  Musculoskeletal: Positive for myalgias.  Skin: Negative for rash.  Neurological: Positive for weakness and numbness. Negative for dizziness.  All other systems reviewed and are negative.     Allergies  Review of patient's allergies indicates no known allergies.  Home Medications   Prior to Admission medications   Medication Sig Start Date End Date Taking? Authorizing Provider  ferrous sulfate 325 (65 FE) MG tablet Take 1 tablet (325 mg total) by mouth 2 (two) times daily with a meal. 01/30/15  Yes Maryruth Bun Rama, MD  polyethylene glycol (MIRALAX / GLYCOLAX) packet Take 17 g by mouth daily. 01/30/15  Yes Maryruth Bun Rama, MD  methocarbamol (ROBAXIN) 750 MG tablet Take 1 tablet (750 mg total) by mouth 3 (three) times daily. 02/04/15 02/07/15  Earley Favor, NP   BP 119/74 mmHg  Pulse 100  Temp(Src) 98.1 F (36.7 C) (Oral)  Resp 25  SpO2 96% Physical Exam  Constitutional: She is oriented to person, place, and time. She appears well-developed and well-nourished. No distress.  HENT:  Head: Normocephalic.  Eyes: Pupils are equal, round, and reactive to light.  Cardiovascular: Tachycardia present.   Pulmonary/Chest: Effort normal.  Abdominal: Soft.  Musculoskeletal: Normal range of motion. She exhibits no edema or tenderness.  Neurological: She is alert and oriented to person, place, and time.  Skin: Skin is warm. No rash noted. She is not diaphoretic. No  erythema.    ED Course  Procedures (including critical care time) Labs Review Labs Reviewed  CBC WITH DIFFERENTIAL/PLATELET - Abnormal; Notable for the following:    Hemoglobin 11.6 (*)    MCV 73.3 (*)    MCH 23.1 (*)    RDW 23.6 (*)    All other components within normal limits  I-STAT CHEM 8, ED - Abnormal; Notable for the following:    Glucose, Bld 100 (*)    All other components within normal limits    Imaging Review No results found. I have personally reviewed and evaluated these images and lab results as part of my medical decision-making.   EKG Interpretation None      MDM   Final diagnoses:  Muscle pain         Earley Favor, NP 02/04/15 1610  Bethann Berkshire, MD 02/04/15 2227

## 2015-02-20 ENCOUNTER — Inpatient Hospital Stay: Payer: Self-pay | Admitting: Family Medicine

## 2015-03-06 ENCOUNTER — Ambulatory Visit (INDEPENDENT_AMBULATORY_CARE_PROVIDER_SITE_OTHER): Payer: BC Managed Care – PPO | Admitting: Family Medicine

## 2015-03-06 ENCOUNTER — Encounter: Payer: Self-pay | Admitting: Family Medicine

## 2015-03-06 ENCOUNTER — Inpatient Hospital Stay: Payer: Self-pay | Admitting: Family Medicine

## 2015-03-06 VITALS — BP 118/76 | HR 80 | Ht 66.5 in | Wt 218.2 lb

## 2015-03-06 DIAGNOSIS — Z7689 Persons encountering health services in other specified circumstances: Secondary | ICD-10-CM

## 2015-03-06 DIAGNOSIS — M25561 Pain in right knee: Secondary | ICD-10-CM

## 2015-03-06 DIAGNOSIS — M79644 Pain in right finger(s): Secondary | ICD-10-CM | POA: Diagnosis not present

## 2015-03-06 DIAGNOSIS — M25562 Pain in left knee: Secondary | ICD-10-CM

## 2015-03-06 DIAGNOSIS — Z7189 Other specified counseling: Secondary | ICD-10-CM | POA: Diagnosis not present

## 2015-03-06 DIAGNOSIS — D509 Iron deficiency anemia, unspecified: Secondary | ICD-10-CM

## 2015-03-06 MED ORDER — IBUPROFEN 800 MG PO TABS: 800.0000 mg | ORAL_TABLET | Freq: Three times a day (TID) | ORAL | Status: DC | PRN

## 2015-03-06 NOTE — Patient Instructions (Addendum)
Take 800 mg of ibuprofen 3 times a day with food for next week and see if this hand and knee pain. Let me know if you're not getting any improvement after a week of this. Make sure you are staying well hydrated.  Continue taking your iron twice a day and follow-up in the next 2-3 weeks for a physical exam.   Osteoarthritis Osteoarthritis is a disease that causes soreness and inflammation of a joint. It occurs when the cartilage at the affected joint wears down. Cartilage acts as a cushion, covering the ends of bones where they meet to form a joint. Osteoarthritis is the most common form of arthritis. It often occurs in older people. The joints affected most often by this condition include those in the:  Ends of the fingers.  Thumbs.  Neck.  Lower back.  Knees.  Hips. CAUSES  Over time, the cartilage that covers the ends of bones begins to wear away. This causes bone to rub on bone, producing pain and stiffness in the affected joints.  RISK FACTORS Certain factors can increase your chances of having osteoarthritis, including:  Older age.  Excessive body weight.  Overuse of joints.  Previous joint injury. SIGNS AND SYMPTOMS   Pain, swelling, and stiffness in the joint.  Over time, the joint may lose its normal shape.  Small deposits of bone (osteophytes) may grow on the edges of the joint.  Bits of bone or cartilage can break off and float inside the joint space. This may cause more pain and damage. DIAGNOSIS  Your health care provider will do a physical exam and ask about your symptoms. Various tests may be ordered, such as:  X-rays of the affected joint.  An MRI scan.  Blood tests to rule out other types of arthritis.  Joint fluid tests. This involves using a needle to draw fluid from the joint and examining the fluid under a microscope. TREATMENT  Goals of treatment are to control pain and improve joint function. Treatment plans may include:  A prescribed exercise  program that allows for rest and joint relief.  A weight control plan.  Pain relief techniques, such as:  Properly applied heat and cold.  Electric pulses delivered to nerve endings under the skin (transcutaneous electrical nerve stimulation [TENS]).  Massage.  Certain nutritional supplements.  Medicines to control pain, such as:  Acetaminophen.  Nonsteroidal anti-inflammatory drugs (NSAIDs), such as naproxen.  Narcotic or central-acting agents, such as tramadol.  Corticosteroids. These can be given orally or as an injection.  Surgery to reposition the bones and relieve pain (osteotomy) or to remove loose pieces of bone and cartilage. Joint replacement may be needed in advanced states of osteoarthritis. HOME CARE INSTRUCTIONS   Take medicines only as directed by your health care provider.  Maintain a healthy weight. Follow your health care provider's instructions for weight control. This may include dietary instructions.  Exercise as directed. Your health care provider can recommend specific types of exercise. These may include:  Strengthening exercises. These are done to strengthen the muscles that support joints affected by arthritis. They can be performed with weights or with exercise bands to add resistance.  Aerobic activities. These are exercises, such as brisk walking or low-impact aerobics, that get your heart pumping.  Range-of-motion activities. These keep your joints limber.  Balance and agility exercises. These help you maintain daily living skills.  Rest your affected joints as directed by your health care provider.  Keep all follow-up visits as directed by  your health care provider. SEEK MEDICAL CARE IF:   Your skin turns red.  You develop a rash in addition to your joint pain.  You have worsening joint pain.  You have a fever along with joint or muscle aches. SEEK IMMEDIATE MEDICAL CARE IF:  You have a significant loss of weight or  appetite.  You have night sweats. FOR MORE INFORMATION   National Institute of Arthritis and Musculoskeletal and Skin Diseases: www.niams.http://www.myers.net/  [employer] on Aging: https://walker.com/  American College of Rheumatology: www.rheumatology.org Document Released: 05/19/2005 Document Revised: 10/03/2013 Document Reviewed: 01/24/2013 [facility] Patient Information 2015 [place], [place]. This information is not intended to replace advice given to you by your health care provider. Make sure you discuss any questions you have with your health care provider.

## 2015-03-06 NOTE — Progress Notes (Signed)
   Subjective:    Patient ID: Karla [name], female    DOB: Jan 04, 1981, 34 y.o.   MRN: [id number]  HPI She is new to the practice and here to establish primary care. She has multiple complaints.. She has history of anemia since high school and was most recently evaluated and treated for this approximately one month ago in the emergency department.. In 2010 she had blood transfusion. 2 years ago during pregnancy she had iron transfusion. One month ago she had to have a unit of blood and iron. No known source of bleeding. Has been taking iron twice daily and using Miralax as needed for constipation. Family history of anemia. Denies dizziness, chest pain, shortness of breath, blood in stool.  She reports left knee pops and swells intermittently for past 3 months, both knees hurt going up and down steps, left greater than right. Denies injury or knee pain in past.  Also reports bilateral hand cramping and weak for last month since her last transfusion. 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 States her hands feel better today than they have in a while because she was off today and has not been typing. No family history of autoimmune disorder but there is history of osteoarthritis.   Last physical exam: September 2015. Pap smear last year. Abnormal.  Mammogram: never Colonoscopy: never Dentist regularly Flu shot: will get at work.  Tdap: 2 years.  She is separated and has 3 kids, 14, 9, 2.   Reviewed allergies, medications, past medical, surgical, family and social history.  Review of Systems Positives and negatives in the history of present illness.    Objective:   Physical Exam  Alert and oriented and in no distress. Bilateral wrists, hands, and fingers without erythema, warmth, or edema, no obvious deformity. Normal sensation, cap refill, ROM, strength. Tenderness to base of right thumb, otherwise non tender bilaterally. Negative Finkelstein.  Negative Tinel's and Phalen's test.  Right and left knees without edema, erythema. Non tender, good patellar mobility,  no laxity, normal strength, ROM,  negative McMurray's.        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 Her pain improves with rest. Will try 800 mg ibuprofen for next week with food and see if this helps. This should also help her knee pain and if not we will evaluate further. Recommend that she continue taking iron twice daily and she will follow-up in 2-3 weeks for physical exam and blood work.

## 2015-03-28 ENCOUNTER — Ambulatory Visit: Payer: BC Managed Care – PPO | Admitting: Family Medicine

## 2015-03-30 ENCOUNTER — Other Ambulatory Visit: Payer: Self-pay | Admitting: Family Medicine

## 2015-03-30 ENCOUNTER — Other Ambulatory Visit: Payer: BC Managed Care – PPO

## 2015-03-30 DIAGNOSIS — Z0000 Encounter for general adult medical examination without abnormal findings: Secondary | ICD-10-CM

## 2015-03-30 DIAGNOSIS — M791 Myalgia, unspecified site: Secondary | ICD-10-CM

## 2015-03-30 DIAGNOSIS — D509 Iron deficiency anemia, unspecified: Secondary | ICD-10-CM

## 2015-03-30 LAB — LIPID PANEL
CHOLESTEROL: 153 mg/dL (ref 125–200)
HDL: 49 mg/dL (ref >=46)
LDL Cholesterol: 87 mg/dL (ref <130)
TRIGLYCERIDES: 84 mg/dL (ref <150)
Total CHOL/HDL Ratio: 3.1 Ratio (ref <=5.0)
VLDL: 17 mg/dL (ref <30)

## 2015-03-30 LAB — COMPREHENSIVE METABOLIC PANEL
ALBUMIN: 4 g/dL (ref 3.6–5.1)
ALK PHOS: 78 U/L (ref 33–115)
ALT: 7 U/L (ref 6–29)
AST: 13 U/L (ref 10–30)
BUN: 12 mg/dL (ref 7–25)
CALCIUM: 9 mg/dL (ref 8.6–10.2)
CO2: 24 mmol/L (ref 20–31)
Chloride: 103 mmol/L (ref 98–110)
Creat: 0.74 mg/dL (ref 0.50–1.10)
Glucose, Bld: 87 mg/dL (ref 65–99)
POTASSIUM: 4.9 mmol/L (ref 3.5–5.3)
Sodium: 139 mmol/L (ref 135–146)
TOTAL PROTEIN: 7.1 g/dL (ref 6.1–8.1)
Total Bilirubin: 0.4 mg/dL (ref 0.2–1.2)

## 2015-03-30 LAB — IRON AND TIBC
%SAT: 16 % (ref 11–50)
Iron: 51 ug/dL (ref 40–190)
TIBC: 311 ug/dL (ref 250–450)
UIBC: 260 ug/dL (ref 125–400)

## 2015-03-30 LAB — TSH: TSH: 2.045 u[IU]/mL (ref 0.350–4.500)

## 2015-03-30 LAB — FERRITIN: FERRITIN: 21 ng/mL (ref 10–291)

## 2015-03-31 LAB — CBC WITH DIFFERENTIAL/PLATELET
BASOS ABS: 0 10*3/uL (ref 0.0–0.1)
Basophils Relative: 0 % (ref 0–1)
Eosinophils Absolute: 0 10*3/uL (ref 0.0–0.7)
Eosinophils Relative: 0 % (ref 0–5)
HEMATOCRIT: 38 % (ref 36.0–46.0)
HEMOGLOBIN: 12.4 g/dL (ref 12.0–15.0)
LYMPHS ABS: 2 10*3/uL (ref 0.7–4.0)
LYMPHS PCT: 28 % (ref 12–46)
MCH: 27.5 pg (ref 26.0–34.0)
MCHC: 32.6 g/dL (ref 30.0–36.0)
MCV: 84.3 fL (ref 78.0–100.0)
Monocytes Absolute: 0.6 10*3/uL (ref 0.1–1.0)
Monocytes Relative: 8 % (ref 3–12)
NEUTROS PCT: 64 % (ref 43–77)
Neutro Abs: 4.5 10*3/uL (ref 1.7–7.7)
PLATELETS: 244 10*3/uL (ref 150–400)
RBC: 4.51 MIL/uL (ref 3.87–5.11)
RDW: 26.8 % — ABNORMAL HIGH (ref 11.5–15.5)
WBC: 7 10*3/uL (ref 4.0–10.5)

## 2015-03-31 LAB — VITAMIN D 25 HYDROXY (VIT D DEFICIENCY, FRACTURES): VIT D 25 HYDROXY: 17 ng/mL — AB (ref 30–100)

## 2015-04-02 ENCOUNTER — Ambulatory Visit (INDEPENDENT_AMBULATORY_CARE_PROVIDER_SITE_OTHER): Payer: BC Managed Care – PPO | Admitting: Family Medicine

## 2015-04-02 ENCOUNTER — Encounter: Payer: Self-pay | Admitting: Family Medicine

## 2015-04-02 VITALS — BP 116/72 | HR 68 | Ht 66.5 in | Wt 227.8 lb

## 2015-04-02 DIAGNOSIS — D509 Iron deficiency anemia, unspecified: Secondary | ICD-10-CM | POA: Diagnosis not present

## 2015-04-02 DIAGNOSIS — Z0000 Encounter for general adult medical examination without abnormal findings: Secondary | ICD-10-CM

## 2015-04-02 DIAGNOSIS — E669 Obesity, unspecified: Secondary | ICD-10-CM | POA: Diagnosis not present

## 2015-04-02 DIAGNOSIS — M25561 Pain in right knee: Secondary | ICD-10-CM | POA: Diagnosis not present

## 2015-04-02 DIAGNOSIS — N644 Mastodynia: Secondary | ICD-10-CM | POA: Diagnosis not present

## 2015-04-02 DIAGNOSIS — E559 Vitamin D deficiency, unspecified: Secondary | ICD-10-CM

## 2015-04-02 DIAGNOSIS — M25562 Pain in left knee: Secondary | ICD-10-CM | POA: Diagnosis not present

## 2015-04-02 LAB — POCT URINALYSIS DIPSTICK
Bilirubin, UA: NEGATIVE
Glucose, UA: NEGATIVE
KETONES UA: NEGATIVE
Nitrite, UA: NEGATIVE
Protein, UA: NEGATIVE
RBC UA: NEGATIVE
Spec Grav, UA: >=1.03
UROBILINOGEN UA: NEGATIVE
pH, UA: 6

## 2015-04-02 MED ORDER — VITAMIN D (ERGOCALCIFEROL) 1.25 MG (50000 UNIT) PO CAPS: 50000.0000 [IU] | ORAL_CAPSULE | ORAL | Status: DC

## 2015-04-02 NOTE — Progress Notes (Signed)
Subjective:    Patient ID: Karla [name], female    DOB: 11-19-1980, 34 y.o.   MRN: [id number]  HPI She is here for a complete physical exam. She had fasting labs 4 days ago. Continues to complain of bilateral anterior knee pain that is worse with movement after sitting for long periods or first thing in morning. States pain improves after walking around on them. She states she walked in a parade this past weekend and has had worse pain past 2 days. States left knee is worse than right. Denies locking or giving away. States she has been taking 800mg  Ibuprofen for past 1-2 weeks with some relief.  Also complains of both breasts being sore on bilateral upper outer quadrants for past 2-3 weeks. States she has never had a mammogram and is going to see her gynecologist in a couple of weeks also.   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 States she would like to lose weight and has been watching her calories and starting to walk more.   Has had Ablation and Tubal ligation in past.   Reviewed allergies, medications, past medical, surgical and social history.    Review of Systems Review of Systems Constitutional: -fever, -chills, -sweats, -unexpected weight change,-fatigue ENT: -runny nose, -ear pain, -sore throat Cardiology:  -chest pain, -palpitations, -edema Respiratory: -cough, -shortness of breath, -wheezing Gastroenterology: -abdominal pain, -nausea, -vomiting, -diarrhea, -constipation  Hematology: -bleeding or bruising problems Musculoskeletal: +arthralgias, -myalgias, -joint swelling, -back pain Ophthalmology: -vision changes Urology: -dysuria, -difficulty urinating, -hematuria, -urinary frequency, -urgency Neurology: -headache, -weakness, -tingling, -numbness       Objective:   Physical Exam  BP 116/72 mmHg  Pulse 68  Ht 5' 6.5" (1.689 m)  Wt 227 lb 12.8 oz (103.329 kg)  BMI 36.22 kg/m2  General Appearance:    Alert, cooperative, no distress, appears  stated age  Head:    Normocephalic, without obvious abnormality, atraumatic  Eyes:    PERRL, conjunctiva/corneas clear, EOM's intact, fundi    benign  Ears:    Normal TM's and external ear canals  Nose:   Nares normal, mucosa normal, no drainage or sinus   tenderness  Throat:   Lips, mucosa, and tongue normal; teeth and gums normal  Neck:   Supple, no lymphadenopathy;  thyroid:  no   enlargement/tenderness/nodules; no carotid   bruit or JVD  Back:    Spine nontender, no curvature, ROM normal, no CVA     tenderness  Lungs:     Clear to auscultation bilaterally without wheezes, rales or     ronchi; respirations unlabored  Chest Wall:    No tenderness or deformity   Heart:    Regular rate and rhythm, S1 and S2 normal, no murmur, rub   or gallop  Breast Exam:     Tenderness to upper outer quadrants bilaterally, no masses, or nipple discharge or inversion.  No axillary lymphadenopathy  Abdomen:     Soft, non-tender, nondistended, normoactive bowel sounds,    no masses, no hepatosplenomegaly  Genitalia:    Deferred, is going to Gynecologist for this- had abnormal paps in past with colposcopy.  Rectal:    Not performed due to age<40 and no related complaints  Extremities:   No clubbing, cyanosis or edema. Left and Right knee without erythema, warmth, edema, negative Mcmurrays.   Pulses:   2+ and symmetric all extremities  Skin:   Skin color, texture, turgor normal, no rashes or lesions  Lymph nodes:  Cervical, supraclavicular, and axillary nodes normal  Neurologic:   CNII-XII intact, normal strength, sensation and gait; reflexes 2+ and symmetric throughout          Psych:   Normal mood, affect, hygiene and grooming.     Urinalysis dipstick: 3+ leuks    Assessment & Plan:  Routine general medical examination at a health care facility - Plan: POCT urinalysis dipstick  Arthralgia of both knees - Plan: DG Knee Complete 4 Views Left  Vitamin D deficiency  Obesity (BMI 30-39.9)  Soreness  breast  Anemia, iron deficiency  Discussed weight loss by calorie intake and increasing physical activity to a minimum of 150 minutes per week. She is using My FitnessPal app and states she is not eating after 6pm. Encouraged her to stay the course and to have a goal of losing a dress or pant size and not focusing daily on her weight. Offered to refer her to nutritionist, she will let me know if she wants to do this. Discussed if left knee XR is negative that she should work on quad strength and demonstrated tightening quad muscle and working last 30 degrees.  Reassured her that having both breasts sore in same area lends itself to an unlikely worrisome diagnosis for breasts, will give this more time and see if it resolves. She would also like to run this by GYN.  Iron, Hemoglobin and Ferritin are now normal. Continue on same iron dose.  She will start taking vitamin D weekly and then daily 1000 IU and we will recheck her D level in 2-3 months.  Discussed that her urine is positive for leukocytes, however she is asymptomatic. Will plan to recheck a UA as a nurse visit in 2-3 weeks. She will let me know if she develops urinary symptoms.

## 2015-04-02 NOTE — Patient Instructions (Signed)
We will let you know the results of your knee x-ray. Your vitamin D at the pharmacy and take one pill weekly for the next 8 weeks. Then you need to take 1000 IU daily after that.  Let me know if you would like a referral to the nutritionist. Also, let me know after you see your gynecologist if they are not in the computer system EPIC  Preventative Care for Adults - Female      MAINTAIN REGULAR HEALTH EXAMS:  A routine yearly physical is a good way to check in with your primary care provider about your health and preventive screening. It is also an opportunity to share updates about your health and any concerns you have, and receive a thorough all-over exam.   Most health insurance companies pay for at least some preventative services.  Check with your health plan for specific coverages.  WHAT PREVENTATIVE SERVICES DO WOMEN NEED?  Adult women should have their weight and blood pressure checked regularly.   Women age 35 and older should have their cholesterol levels checked regularly.  Women should be screened for cervical cancer with a Pap smear and pelvic exam beginning at either age 35, or 3 years after they become sexually activity.    Breast cancer screening generally begins at age 35 with a mammogram and breast exam by your primary care provider.    Beginning at age 35 and continuing to age 35, women should be screened for colorectal cancer.  Certain people may need continued testing until age 35.  Updating vaccinations is part of preventative care.  Vaccinations help protect against diseases such as the flu.  Osteoporosis is a disease in which the bones lose minerals and strength as we age. Women ages 4065 and over should discuss this with their caregivers, as should women after menopause who have other risk factors.  Lab tests are generally done as part of preventative care to screen for anemia and blood disorders, to screen for problems with the kidneys and liver, to screen for  bladder problems, to check blood sugar, and to check your cholesterol level.  Preventative services generally include counseling about diet, exercise, avoiding tobacco, drugs, excessive alcohol consumption, and sexually transmitted infections.    GENERAL RECOMMENDATIONS FOR GOOD HEALTH:  Healthy diet:  Eat a variety of foods, including fruit, vegetables, animal or vegetable protein, such as meat, fish, chicken, and eggs, or beans, lentils, tofu, and grains, such as rice.  Drink plenty of water daily.  Decrease saturated fat in the diet, avoid lots of red meat, processed foods, sweets, fast foods, and fried foods.  Exercise:  Aerobic exercise helps maintain good heart health. At least 30-40 minutes of moderate-intensity exercise is recommended. For example, a brisk walk that increases your heart rate and breathing. This should be done on most days of the week.   Find a type of exercise or a variety of exercises that you enjoy so that it becomes a part of your daily life.  Examples are running, walking, swimming, water aerobics, and biking.  For motivation and support, explore group exercise such as aerobic class, spin class, Zumba, Yoga,or  martial arts, etc.    Set exercise goals for yourself, such as a certain weight goal, walk or run in a race such as a 5k walk/run.  Speak to your primary care provider about exercise goals.  Disease prevention:  If you smoke or chew tobacco, find out from your caregiver how to quit. It can literally save your  life, no matter how long you have been a tobacco user. If you do not use tobacco, never begin.   Maintain a healthy diet and normal weight. Increased weight leads to problems with blood pressure and diabetes.   The Body Mass Index or BMI is a way of measuring how much of your body is fat. Having a BMI above 27 increases the risk of heart disease, diabetes, hypertension, stroke and other problems related to obesity. Your caregiver can help determine  your BMI and based on it develop an exercise and dietary program to help you achieve or maintain this important measurement at a healthful level.  High blood pressure causes heart and blood vessel problems.  Persistent high blood pressure should be treated with medicine if weight loss and exercise do not work.   Fat and cholesterol leaves deposits in your arteries that can block them. This causes heart disease and vessel disease elsewhere in your body.  If your cholesterol is found to be high, or if you have heart disease or certain other medical conditions, then you may need to have your cholesterol monitored frequently and be treated with medication.   Ask if you should have a cardiac stress test if your history suggests this. A stress test is a test done on a treadmill that looks for heart disease. This test can find disease prior to there being a problem.  Menopause can be associated with physical symptoms and risks. Hormone replacement therapy is available to decrease these. You should talk to your caregiver about whether starting or continuing to take hormones is right for you.   Osteoporosis is a disease in which the bones lose minerals and strength as we age. This can result in serious bone fractures. Risk of osteoporosis can be identified using a bone density scan. Women ages 29 and over should discuss this with their caregivers, as should women after menopause who have other risk factors. Ask your caregiver whether you should be taking a calcium supplement and Vitamin D, to reduce the rate of osteoporosis.   Avoid drinking alcohol in excess (more than two drinks per day).  Avoid use of street drugs. Do not share needles with anyone. Ask for professional help if you need assistance or instructions on stopping the use of alcohol, cigarettes, and/or drugs.  Brush your teeth twice a day with fluoride toothpaste, and floss once a day. Good oral hygiene prevents tooth decay and gum disease. The  problems can be painful, unattractive, and can cause other health problems. Visit your dentist for a routine oral and dental check up and preventive care every 6-12 months.   Look at your skin regularly.  Use a mirror to look at your back. Notify your caregivers of changes in moles, especially if there are changes in shapes, colors, a size larger than a pencil eraser, an irregular border, or development of new moles.  Safety:  Use seatbelts 100% of the time, whether driving or as a passenger.  Use safety devices such as hearing protection if you work in environments with loud noise or significant background noise.  Use safety glasses when doing any work that could send debris in to the eyes.  Use a helmet if you ride a bike or motorcycle.  Use appropriate safety gear for contact sports.  Talk to your caregiver about gun safety.  Use sunscreen with a SPF (or skin protection factor) of 15 or greater.  Lighter skinned people are at a greater risk of skin cancer. Don't  forget to also wear sunglasses in order to protect your eyes from too much damaging sunlight. Damaging sunlight can accelerate cataract formation.   Practice safe sex. Use condoms. Condoms are used for birth control and to help reduce the spread of sexually transmitted infections (or STIs).  Some of the STIs are gonorrhea (the clap), chlamydia, syphilis, trichomonas, herpes, HPV (human papilloma virus) and HIV (human immunodeficiency virus) which causes AIDS. The herpes, HIV and HPV are viral illnesses that have no cure. These can result in disability, cancer and death.   Keep carbon monoxide and smoke detectors in your home functioning at all times. Change the batteries every 6 months or use a model that plugs into the wall.   Vaccinations:  Stay up to date with your tetanus shots and other required immunizations. You should have a booster for tetanus every 10 years. Be sure to get your flu shot every year, since 5%-20% of the [place]  population comes down with the flu. The flu vaccine changes each year, so being vaccinated once is not enough. Get your shot in the fall, before the flu season peaks.   Other vaccines to consider:  Human Papilloma Virus or HPV causes cancer of the cervix, and other infections that can be transmitted from person to person. There is a vaccine for HPV, and females should get immunized between the ages of 5 and 32. It requires a series of 3 shots.   Pneumococcal vaccine to protect against certain types of pneumonia.  This is normally recommended for adults age 102 or older.  However, adults younger than 35 years old with certain underlying conditions such as diabetes, heart or lung disease should also receive the vaccine.  Shingles vaccine to protect against Varicella Zoster if you are older than age 1, or younger than 35 years old with certain underlying illness.  Hepatitis A vaccine to protect against a form of infection of the liver by a virus acquired from food.  Hepatitis B vaccine to protect against a form of infection of the liver by a virus acquired from blood or body fluids, particularly if you work in health care.  If you plan to travel internationally, check with your local health department for specific vaccination recommendations.  Cancer Screening:  Breast cancer screening is essential to preventive care for women. All women age 101 and older should perform a breast self-exam every month. At age 77 and older, women should have their caregiver complete a breast exam each year. Women at ages 75 and older should have a mammogram (x-ray film) of the breasts. Your caregiver can discuss how often you need mammograms.    Cervical cancer screening includes taking a Pap smear (sample of cells examined under a microscope) from the cervix (end of the uterus). It also includes testing for HPV (Human Papilloma Virus, which can cause cervical cancer). Screening and a pelvic exam should begin at age  22, or 3 years after a woman becomes sexually active. Screening should occur every year, with a Pap smear but no HPV testing, up to age 42. After age 94, you should have a Pap smear every 3 years with HPV testing, if no HPV was found previously.   Most routine colon cancer screening begins at the age of 50. On a yearly basis, doctors may provide special easy to use take-home tests to check for hidden blood in the stool. Sigmoidoscopy or colonoscopy can detect the earliest forms of colon cancer and is life saving. These tests use  a small camera at the end of a tube to directly examine the colon. Speak to your caregiver about this at age 90, when routine screening begins (and is repeated every 5 years unless early forms of pre-cancerous polyps or small growths are found).

## 2015-04-04 ENCOUNTER — Telehealth: Payer: Self-pay | Admitting: Family Medicine

## 2015-04-04 NOTE — Telephone Encounter (Signed)
Ms Karla [name] called and stated that she thanks she has a UTI she is itching and and having frequent urination and discharge and the bottom of her stomach feels heavy, wants to know if you would send her something in, pt can be reached at [phone] and [facility] [address]

## 2015-04-06 ENCOUNTER — Ambulatory Visit
Admission: RE | Admit: 2015-04-06 | Discharge: 2015-04-06 | Disposition: A | Discharge status: Home / Self Care | Payer: BC Managed Care – PPO | Source: Ambulatory Visit | Attending: Family Medicine | Admitting: Family Medicine

## 2015-04-06 DIAGNOSIS — M25561 Pain in right knee: Secondary | ICD-10-CM

## 2015-04-06 DIAGNOSIS — M25562 Pain in left knee: Principal | ICD-10-CM

## 2015-04-06 MED ORDER — SULFAMETHOXAZOLE-TRIMETHOPRIM 800-160 MG PO TABS
1.0000 | ORAL_TABLET | Freq: Two times a day (BID) | ORAL | Status: DC
Start: 2015-04-06 — End: 2015-07-18

## 2015-04-06 NOTE — Telephone Encounter (Signed)
Please call and ask her if she has taken an antibiotic for anything in past 6 months. No allergies correct? Let her know that we will call in an antibiotic for her and to call if she is not back to normal after finishing it.

## 2015-04-06 NOTE — Telephone Encounter (Signed)
Left message for pt to call me back 

## 2015-04-06 NOTE — Telephone Encounter (Signed)
Spoke to patient and she is not allergic to anything and not been on any antibitoic in the last 6 months. Spoke with vickie and she said to send Bactrim DS 800-160mg  1 tablet BID for 3 days no refills to pharmacy

## 2015-06-11 ENCOUNTER — Ambulatory Visit: Payer: BC Managed Care – PPO | Admitting: Family Medicine

## 2015-06-27 ENCOUNTER — Ambulatory Visit: Payer: BC Managed Care – PPO | Admitting: Family Medicine

## 2015-07-18 ENCOUNTER — Encounter: Payer: Self-pay | Admitting: Family Medicine

## 2015-07-18 ENCOUNTER — Ambulatory Visit (INDEPENDENT_AMBULATORY_CARE_PROVIDER_SITE_OTHER): Payer: BC Managed Care – PPO | Admitting: Family Medicine

## 2015-07-18 VITALS — BP 122/80 | HR 68 | Wt 247.4 lb

## 2015-07-18 DIAGNOSIS — R635 Abnormal weight gain: Secondary | ICD-10-CM | POA: Diagnosis not present

## 2015-07-18 DIAGNOSIS — E559 Vitamin D deficiency, unspecified: Secondary | ICD-10-CM

## 2015-07-18 LAB — CBC WITH DIFFERENTIAL/PLATELET
BASOS ABS: 0 10*3/uL (ref 0.0–0.1)
Basophils Relative: 0 % (ref 0–1)
Eosinophils Absolute: 0 10*3/uL (ref 0.0–0.7)
Eosinophils Relative: 0 % (ref 0–5)
HEMATOCRIT: 39.4 % (ref 36.0–46.0)
HEMOGLOBIN: 12.7 g/dL (ref 12.0–15.0)
LYMPHS ABS: 2.9 10*3/uL (ref 0.7–4.0)
LYMPHS PCT: 31 % (ref 12–46)
MCH: 30.2 pg (ref 26.0–34.0)
MCHC: 32.2 g/dL (ref 30.0–36.0)
MCV: 93.8 fL (ref 78.0–100.0)
MPV: 11 fL (ref 8.6–12.4)
Monocytes Absolute: 0.7 10*3/uL (ref 0.1–1.0)
Monocytes Relative: 8 % (ref 3–12)
NEUTROS ABS: 5.6 10*3/uL (ref 1.7–7.7)
NEUTROS PCT: 61 % (ref 43–77)
Platelets: 315 10*3/uL (ref 150–400)
RBC: 4.2 MIL/uL (ref 3.87–5.11)
RDW: 13.2 % (ref 11.5–15.5)
WBC: 9.2 10*3/uL (ref 4.0–10.5)

## 2015-07-18 NOTE — Progress Notes (Signed)
   Subjective:    Patient ID: Karla [name], female    DOB: 21-May-1981, 35 y.o.   MRN: [id number]  HPI Chief Complaint  Patient presents with  . follow-up    follow-up from 10/31 visit   She is here for follow-up on vitamin D deficiency. She took prescription vitamin D for 8 weeks and has not been taking over-the-counter vitamin D since then. We need to follow up on this. She saw [facility] with Dr. Jackelyn Knife in December and had a Pap smear. She states it came back abnormal and she has a biopsy scheduled for 08/13/2015.   She complains of 20 lbs weight gain since her last visit in October. She states she feels "swollen" in hands and feet. Denies increase in calories. She states she has started walking 30 minutes for 3 days a week and does Zumba on Saturday.  States her clothes are fitting more tightly.  Denies fever, chills, fatigue, cough, DOE, headache, chest pain, palpitations, nausea, vomiting, diarrhea or constipation. She does take miralax but states this keeps her bowels regular. Denies history of fibroids.  She is not on any new medications. No hormones or birth control. Tubal ligation after her last child about 3 years ago. Periods are regular.    Review of Systems Pertinent positives and negatives in the history of present illness.     Objective:   Physical Exam BP 122/80 mmHg  Pulse 68  Wt 247 lb 6.4 oz (112.22 kg)  Alert and in no distress. Pharyngeal area is normal. Neck is supple without adenopathy or thyromegaly. Cardiac exam shows a regular sinus rhythm without murmurs or gallops. Lungs are clear to auscultation. No edema to lower extremities.        Assessment & Plan:  Vitamin D deficiency - Plan: VITAMIN D 25 Hydroxy (Vit-D Deficiency, Fractures)  Excessive weight gain - Plan: CBC with Differential/Platelet, Comprehensive metabolic panel, TSH  Discussed that we will recheck her vitamin D level and see if she is within normal range now. We will also  check blood work to rule out underlying etiology for unexplained weight gain. Recommend that she keep a 2 week food and activity diary and follow up to go over this. Discussed that sometimes eat too few calories will slow her metabolism down and eating too many will obviously increase weight. She is not having any cardiac, GI or other symptoms that would explain her recent weight gain. Follow up in 2 weeks or sooner pending labs.

## 2015-07-18 NOTE — Patient Instructions (Signed)
We will call you with your lab results. Keep a food diary for the next 2 weeks and bring this when you follow up.  Calorie Counting for Weight Loss Calories are energy you get from the things you eat and drink. Your body uses this energy to keep you going throughout the day. The number of calories you eat affects your weight. When you eat more calories than your body needs, your body stores the extra calories as fat. When you eat fewer calories than your body needs, your body burns fat to get the energy it needs. Calorie counting means keeping track of how many calories you eat and drink each day. If you make sure to eat fewer calories than your body needs, you should lose weight. In order for calorie counting to work, you will need to eat the number of calories that are right for you in a day to lose a healthy amount of weight per week. A healthy amount of weight to lose per week is usually 1-2 lb (0.5-0.9 kg). A dietitian can determine how many calories you need in a day and give you suggestions on how to reach your calorie goal.  WHAT IS MY MY PLAN? My goal is to have __________ calories per day.  If I have this many calories per day, I should lose around __________ pounds per week. WHAT DO I NEED TO KNOW ABOUT CALORIE COUNTING? In order to meet your daily calorie goal, you will need to:  Find out how many calories are in each food you would like to eat. Try to do this before you eat.  Decide how much of the food you can eat.  Write down what you ate and how many calories it had. Doing this is called keeping a food log. WHERE DO I FIND CALORIE INFORMATION? The number of calories in a food can be found on a Nutrition Facts label. Note that all the information on a label is based on a specific serving of the food. If a food does not have a Nutrition Facts label, try to look up the calories online or ask your dietitian for help. HOW DO I DECIDE HOW MUCH TO EAT? To decide how much of the food you  can eat, you will need to consider both the number of calories in one serving and the size of one serving. This information can be found on the Nutrition Facts label. If a food does not have a Nutrition Facts label, look up the information online or ask your dietitian for help. Remember that calories are listed per serving. If you choose to have more than one serving of a food, you will have to multiply the calories per serving by the amount of servings you plan to eat. For example, the label on a package of bread might say that a serving size is 1 slice and that there are 90 calories in a serving. If you eat 1 slice, you will have eaten 90 calories. If you eat 2 slices, you will have eaten 180 calories. HOW DO I KEEP A FOOD LOG? After each meal, record the following information in your food log:  What you ate.  How much of it you ate.  How many calories it had.  Then, add up your calories. Keep your food log near you, such as in a small notebook in your pocket. Another option is to use a mobile app or website. Some programs will calculate calories for you and show you how many  calories you have left each time you add an item to the log. WHAT ARE SOME CALORIE COUNTING TIPS?  Use your calories on foods and drinks that will fill you up and not leave you hungry. Some examples of this include foods like nuts and nut butters, vegetables, lean proteins, and high-fiber foods (more than 5 g fiber per serving).  Eat nutritious foods and avoid empty calories. Empty calories are calories you get from foods or beverages that do not have many nutrients, such as candy and soda. It is better to have a nutritious high-calorie food (such as an avocado) than a food with few nutrients (such as a bag of chips).  Know how many calories are in the foods you eat most often. This way, you do not have to look up how many calories they have each time you eat them.  Look out for foods that may seem like low-calorie foods  but are really high-calorie foods, such as baked goods, soda, and fat-free candy.  Pay attention to calories in drinks. Drinks such as sodas, specialty coffee drinks, alcohol, and juices have a lot of calories yet do not fill you up. Choose low-calorie drinks like water and diet drinks.  Focus your calorie counting efforts on higher calorie items. Logging the calories in a garden salad that contains only vegetables is less important than calculating the calories in a milk shake.  Find a way of tracking calories that works for you. Get creative. Most people who are successful find ways to keep track of how much they eat in a day, even if they do not count every calorie. WHAT ARE SOME PORTION CONTROL TIPS?  Know how many calories are in a serving. This will help you know how many servings of a certain food you can have.  Use a measuring cup to measure serving sizes. This is helpful when you start out. With time, you will be able to estimate serving sizes for some foods.  Take some time to put servings of different foods on your favorite plates, bowls, and cups so you know what a serving looks like.  Try not to eat straight from a bag or box. Doing this can lead to overeating. Put the amount you would like to eat in a cup or on a plate to make sure you are eating the right portion.  Use smaller plates, glasses, and bowls to prevent overeating. This is a quick and easy way to practice portion control. If your plate is smaller, less food can fit on it.  Try not to multitask while eating, such as watching TV or using your computer. If it is time to eat, sit down at a table and enjoy your food. Doing this will help you to start recognizing when you are full. It will also make you more aware of what and how much you are eating. HOW CAN I CALORIE COUNT WHEN EATING OUT?  Ask for smaller portion sizes or child-sized portions.  Consider sharing an entree and sides instead of getting your own  entree.  If you get your own entree, eat only half. Ask for a box at the beginning of your meal and put the rest of your entree in it so you are not tempted to eat it.  Look for the calories on the menu. If calories are listed, choose the lower calorie options.  Choose dishes that include vegetables, fruits, whole grains, low-fat dairy products, and lean protein. Focusing on smart food choices from each  of the 5 food groups can help you stay on track at restaurants.  Choose items that are boiled, broiled, grilled, or steamed.  Choose water, milk, unsweetened iced tea, or other drinks without added sugars. If you want an alcoholic beverage, choose a lower calorie option. For example, a regular margarita can have up to 700 calories and a glass of wine has around 150.  Stay away from items that are buttered, battered, fried, or served with cream sauce. Items labeled "crispy" are usually fried, unless stated otherwise.  Ask for dressings, sauces, and syrups on the side. These are usually very high in calories, so do not eat much of them.  Watch out for salads. Many people think salads are a healthy option, but this is often not the case. Many salads come with bacon, fried chicken, lots of cheese, fried chips, and dressing. All of these items have a lot of calories. If you want a salad, choose a garden salad and ask for grilled meats or steak. Ask for the dressing on the side, or ask for olive oil and vinegar or lemon to use as dressing.  Estimate how many servings of a food you are given. For example, a serving of cooked rice is  cup or about the size of half a tennis ball or one cupcake wrapper. Knowing serving sizes will help you be aware of how much food you are eating at restaurants. The list below tells you how big or small some common portion sizes are based on everyday objects.  1 oz--4 stacked dice.  3 oz--1 deck of cards.  1 tsp--1 dice.  1 Tbsp-- a Ping-Pong ball.  2 Tbsp--1  Ping-Pong ball.   cup--1 tennis ball or 1 cupcake wrapper.  1 cup--1 baseball.   This information is not intended to replace advice given to you by your health care provider. Make sure you discuss any questions you have with your health care provider.   Document Released: 05/19/2005 Document Revised: 06/09/2014 Document Reviewed: 03/24/2013 Elsevier Interactive Patient Education [employer]

## 2015-07-19 ENCOUNTER — Other Ambulatory Visit: Payer: BC Managed Care – PPO

## 2015-07-19 LAB — COMPREHENSIVE METABOLIC PANEL
ALK PHOS: 87 U/L (ref 33–115)
ALT: 6 U/L (ref 6–29)
AST: 12 U/L (ref 10–30)
Albumin: 4 g/dL (ref 3.6–5.1)
BILIRUBIN TOTAL: 0.3 mg/dL (ref 0.2–1.2)
BUN: 12 mg/dL (ref 7–25)
CALCIUM: 9.3 mg/dL (ref 8.6–10.2)
CO2: 26 mmol/L (ref 20–31)
Chloride: 104 mmol/L (ref 98–110)
Creat: 0.84 mg/dL (ref 0.50–1.10)
GLUCOSE: 114 mg/dL — AB (ref 65–99)
POTASSIUM: 3.8 mmol/L (ref 3.5–5.3)
Sodium: 139 mmol/L (ref 135–146)
TOTAL PROTEIN: 7.4 g/dL (ref 6.1–8.1)

## 2015-07-19 LAB — VITAMIN D 25 HYDROXY (VIT D DEFICIENCY, FRACTURES): VIT D 25 HYDROXY: 24 ng/mL — AB (ref 30–100)

## 2015-07-19 LAB — TSH: TSH: 1.5 mIU/L

## 2015-07-19 NOTE — Addendum Note (Signed)
Addended by: Herminio Commons A on: 07/19/2015 10:06 AM   Modules accepted: Orders

## 2015-08-01 ENCOUNTER — Ambulatory Visit (INDEPENDENT_AMBULATORY_CARE_PROVIDER_SITE_OTHER): Payer: BC Managed Care – PPO | Admitting: Family Medicine

## 2015-08-01 ENCOUNTER — Encounter: Payer: Self-pay | Admitting: Family Medicine

## 2015-08-01 VITALS — BP 124/82 | HR 75 | Wt 242.0 lb

## 2015-08-01 DIAGNOSIS — Z09 Encounter for follow-up examination after completed treatment for conditions other than malignant neoplasm: Secondary | ICD-10-CM

## 2015-08-01 DIAGNOSIS — E559 Vitamin D deficiency, unspecified: Secondary | ICD-10-CM | POA: Insufficient documentation

## 2015-08-01 DIAGNOSIS — R87619 Unspecified abnormal cytological findings in specimens from cervix uteri: Secondary | ICD-10-CM | POA: Insufficient documentation

## 2015-08-01 DIAGNOSIS — R635 Abnormal weight gain: Secondary | ICD-10-CM | POA: Diagnosis not present

## 2015-08-01 NOTE — Progress Notes (Signed)
   Subjective:    Patient ID: Karla [name], female    DOB: Nov 04, 1980, 35 y.o.   MRN: [id number]  HPI Chief Complaint  Patient presents with  . Follow-up    pt thinks it is about weight   She is a 35 year old female with history of vitamin D deficiency, iron deficiency anemia, and abnormal pap smear who is here for follow-up on weight gain. At her last appointment she complained of a 20 lb weight gain that she stated was unexplained. Lab results did not reveal an explanation for this weight gain. We discussed that she would keep a food and activity log and return today to go over this and see if there was any correlation to calorie intake and weight gain. She states she forgot to keep the food diary did not bring one today. She states she has had increased work stress and has had to focus more on that. She has lost 5 lbs since her last visit and states she has been watching her calorie intake and trying to keep to a 1200-1300 calories per day. She also reports walking for exercise but states she gets winded and doesn't go very far. She thinks she is deconditioned.   Has appointment with GYN in 2 weeks for biopsy for abnormal pap smear.    Review of Systems Pertinent positives and negatives in the history of present illness.     Objective:   Physical Exam BP 124/82 mmHg  Pulse 75  Wt 242 lb (109.77 kg) Alert and in no distress.Neck is supple without adenopathy or thyromegaly. Cardiac exam shows a regular sinus rhythm without murmurs or gallops. Lungs are clear to auscultation. Extremities without edema.       Assessment & Plan:  Follow up  Discussed that she would continue to watch calorie intake, gradually increase physical activity and keep up with weight. Recommend keeping an eye on how her clothes are fitting and weigh once per week. She will let me know if she is continuing to gain in spite of lifestyle modifications.  Follow up in 1 month or sooner if needed. She declines  nutritionist at this time due to time constraints at work.

## 2015-08-07 ENCOUNTER — Ambulatory Visit: Payer: BC Managed Care – PPO | Admitting: Family Medicine

## 2016-01-24 ENCOUNTER — Emergency Department (HOSPITAL_COMMUNITY)
Admission: EM | Admit: 2016-01-24 | Discharge: 2016-01-24 | Disposition: A | Discharge status: Home / Self Care | Payer: BC Managed Care – PPO | Attending: Emergency Medicine | Admitting: Emergency Medicine

## 2016-01-24 ENCOUNTER — Encounter (HOSPITAL_COMMUNITY): Payer: Self-pay | Admitting: Emergency Medicine

## 2016-01-24 DIAGNOSIS — R531 Weakness: Secondary | ICD-10-CM | POA: Insufficient documentation

## 2016-01-24 DIAGNOSIS — Z79899 Other long term (current) drug therapy: Secondary | ICD-10-CM | POA: Diagnosis not present

## 2016-01-24 DIAGNOSIS — R5383 Other fatigue: Secondary | ICD-10-CM | POA: Diagnosis present

## 2016-01-24 DIAGNOSIS — Z791 Long term (current) use of non-steroidal anti-inflammatories (NSAID): Secondary | ICD-10-CM | POA: Insufficient documentation

## 2016-01-24 LAB — TYPE AND SCREEN
ABO/RH(D): B POS
Antibody Screen: NEGATIVE

## 2016-01-24 LAB — CBC WITH DIFFERENTIAL/PLATELET
BASOS PCT: 0 %
Basophils Absolute: 0 10*3/uL (ref 0.0–0.1)
EOS ABS: 0 10*3/uL (ref 0.0–0.7)
EOS PCT: 0 %
HCT: 38.2 % (ref 36.0–46.0)
HEMOGLOBIN: 12.5 g/dL (ref 12.0–15.0)
LYMPHS ABS: 2 10*3/uL (ref 0.7–4.0)
Lymphocytes Relative: 29 %
MCH: 30 pg (ref 26.0–34.0)
MCHC: 32.7 g/dL (ref 30.0–36.0)
MCV: 91.6 fL (ref 78.0–100.0)
Monocytes Absolute: 0.5 10*3/uL (ref 0.1–1.0)
Monocytes Relative: 7 %
NEUTROS PCT: 64 %
Neutro Abs: 4.5 10*3/uL (ref 1.7–7.7)
PLATELETS: 299 10*3/uL (ref 150–400)
RBC: 4.17 MIL/uL (ref 3.87–5.11)
RDW: 14.1 % (ref 11.5–15.5)
WBC: 7 10*3/uL (ref 4.0–10.5)

## 2016-01-24 LAB — BASIC METABOLIC PANEL
Anion gap: 5 (ref 5–15)
BUN: 10 mg/dL (ref 6–20)
CHLORIDE: 107 mmol/L (ref 101–111)
CO2: 27 mmol/L (ref 22–32)
CREATININE: 0.77 mg/dL (ref 0.44–1.00)
Calcium: 9.2 mg/dL (ref 8.9–10.3)
Glucose, Bld: 90 mg/dL (ref 65–99)
POTASSIUM: 3.9 mmol/L (ref 3.5–5.1)
SODIUM: 139 mmol/L (ref 135–145)

## 2016-01-24 LAB — I-STAT BETA HCG BLOOD, ED (MC, WL, AP ONLY)

## 2016-01-24 NOTE — ED Notes (Signed)
Attempted x 2 to start IV and draw blood. Another staff member in to attempt.

## 2016-01-24 NOTE — ED Triage Notes (Signed)
Patient states "i feel like my iron is low, that is a reoccurring problem".  C/o fatigue, head pressure, chills, muscle tightness since last Friday.

## 2016-01-24 NOTE — Discharge Instructions (Signed)
You blood work overall looks good today. YOu are not anemic  Return for worsening symptoms, including fever, confusion, intractable vomiting, passing out or any other symptoms concerning to you.  Drink plenty of fluids and get rest.

## 2016-01-24 NOTE — ED Provider Notes (Signed)
[facility]-EMERGENCY DEPT Provider Note   CSN: [id number] Arrival date & time: 01/24/16  0721     History   Chief Complaint Chief Complaint  Patient presents with  . Fatigue  . Chills  . Muscle Pain    HPI Earline N Excell SeltzerBaker is a 35 y.o. female.  HPI  35 year old female with history of anemia who presents with fatigue and generalized weakness. Started over the weekend, 4-5 days ago. Feels cold, pressure headache, tingling in fingers. History of uterine ablation for menorrhagia, but recently has had recurrent menses (although not as heavy). LMP 8/17, 3 days in length, "heavy spotting." No fever, chills, vomiting, diarrhea, cough, difficulty breathing, syncope, but times feels lightheaded. No melena or hematochezia.  Past Medical History:  Diagnosis Date  . Abnormal Pap smear   . GERD (gastroesophageal reflux disease)   . H/O transfusion of packed red blood cells   . Herpes   . Iron deficiency anemia    since 35 y.o.      Patient Active Problem List   Diagnosis Date Noted  . Vitamin D deficiency 08/01/2015  . Abnormal weight gain 08/01/2015  . Abnormal Pap smear of cervix 08/01/2015  . Symptomatic anemia 01/29/2015  . Anemia, iron deficiency 01/29/2015    Past Surgical History:  Procedure Laterality Date  . TUBAL LIGATION Bilateral 10/28/2012   Procedure: POST PARTUM TUBAL LIGATION;  Surgeon: Bing Plumehomas F Henley, MD;  Location: [facility];  Service: Gynecology;  Laterality: Bilateral;  . Uterine ablation      OB History    Gravida Para Term Preterm AB Living   3 3 3     3    SAB TAB Ectopic Multiple Live Births           3       Home Medications    Prior to Admission medications   Medication Sig Start Date End Date Taking? Authorizing Provider  ferrous sulfate 325 (65 FE) MG tablet Take 1 tablet (325 mg total) by mouth 2 (two) times daily with a meal. 01/30/15  Yes Christina P Rama, MD  ibuprofen (ADVIL,MOTRIN) 200 MG tablet Take 400 mg by mouth every 6 (six) hours as needed  for moderate pain.   Yes Historical Provider, MD  polyethylene glycol (MIRALAX / GLYCOLAX) packet Take 17 g by mouth daily. Patient taking differently: Take 17 g by mouth daily as needed for mild constipation, moderate constipation or severe constipation.  01/30/15  Yes Maryruth Bunhristina P Rama, MD  ibuprofen (ADVIL,MOTRIN) 800 MG tablet Take 1 tablet (800 mg total) by mouth every 8 (eight) hours as needed. Patient not taking: Reported on 07/18/2015 03/06/15   Avanell ShackletonVickie L Henson, NP  Vitamin D, Ergocalciferol, (DRISDOL) 50000 UNITS CAPS capsule Take 1 capsule (50,000 Units total) by mouth every 7 (seven) days. Patient not taking: Reported on 01/24/2016 04/02/15   Avanell ShackletonVickie L Henson, NP    Family History Family History  Problem Relation Age of Onset  . Diabetes Father   . Diabetes Maternal Grandmother   . Hypertension Maternal Grandmother   . Diabetes Maternal Grandfather   . Anemia Paternal Grandmother   . Cancer Paternal Grandfather     prostate  . Anemia Cousin     Social History Social History  Substance Use Topics  . Smoking status: Never Smoker  . Smokeless tobacco: Never Used  . Alcohol use No     Comment: NOT WHILE PREG     Allergies   Review of patient's allergies indicates no known  allergies.   Review of Systems Review of Systems 10/14 systems reviewed and are negative other than those stated in the HPI   Physical Exam Updated Vital Signs BP 133/78   Pulse 63   Temp 98.4 F (36.9 C) (Oral)   Resp 16   Ht 5' 7.5" (1.715 m)   Wt 233 lb (105.7 kg)   SpO2 100%   BMI 35.95 kg/m   Physical Exam Physical Exam  Nursing note and vitals reviewed. Constitutional: Well developed, well nourished, non-toxic, and in no acute distress Head: Normocephalic and atraumatic.  Mouth/Throat: Oropharynx is clear and moist.  Neck: Normal range of motion. Neck supple.  Cardiovascular: Normal rate and regular rhythm.   Pulmonary/Chest: Effort normal and breath sounds normal.  Abdominal:  Soft. There is no tenderness. There is no rebound and no guarding.  Musculoskeletal: Normal range of motion.  Neurological: Alert, no facial droop, fluent speech, moves all extremities symmetrically Skin: Skin is warm and dry.  Psychiatric: Cooperative   ED Treatments / Results  Labs (all labs ordered are listed, but only abnormal results are displayed) Labs Reviewed  CBC WITH DIFFERENTIAL/PLATELET  BASIC METABOLIC PANEL  I-STAT BETA HCG BLOOD, ED ([facility], [facility], [facility] ONLY)  TYPE AND SCREEN    EKG  EKG Interpretation None       Radiology No results found.  Procedures Procedures (including critical care time)  Medications Ordered in ED Medications - No data to display   Initial Impression / Assessment and Plan / ED Course  I have reviewed the triage vital signs and the nursing notes.  Pertinent labs & imaging results that were available during my care of the patient were reviewed by me and considered in my medical decision making (see chart for details).  Clinical Course    35 year old female who presents with few days of fatigue and weakness. VS normal. She is well appearing. In no acute distress. nonfocal exam. Blood work w/o anemia. No metabolic or electrolyte derangements. She is felt stable for discharge home. The patient appears reasonably screened and/or stabilized for discharge and I doubt any other medical condition or other [facility] requiring further screening, evaluation, or treatment in the ED at this time prior to discharge. Discussed supportive care instructions for home. Strict return and follow-up instructions reviewed. She expressed understanding of all discharge instructions and felt comfortable with the plan of care.    Final Clinical Impressions(s) / ED Diagnoses   Final diagnoses:  Weakness    New Prescriptions New Prescriptions   No medications on file     Lavera Guiseana Duo Nuchem Grattan, MD 01/24/16 1009

## 2016-09-01 ENCOUNTER — Encounter: Payer: Self-pay | Admitting: Family Medicine

## 2016-09-01 ENCOUNTER — Ambulatory Visit (INDEPENDENT_AMBULATORY_CARE_PROVIDER_SITE_OTHER): Payer: BC Managed Care – PPO | Admitting: Family Medicine

## 2016-09-01 VITALS — BP 124/80 | HR 84 | Ht 67.5 in | Wt 240.2 lb

## 2016-09-01 DIAGNOSIS — E669 Obesity, unspecified: Secondary | ICD-10-CM | POA: Diagnosis not present

## 2016-09-01 DIAGNOSIS — H538 Other visual disturbances: Secondary | ICD-10-CM | POA: Diagnosis not present

## 2016-09-01 DIAGNOSIS — R5383 Other fatigue: Secondary | ICD-10-CM

## 2016-09-01 DIAGNOSIS — R030 Elevated blood-pressure reading, without diagnosis of hypertension: Secondary | ICD-10-CM | POA: Diagnosis not present

## 2016-09-01 DIAGNOSIS — E559 Vitamin D deficiency, unspecified: Secondary | ICD-10-CM

## 2016-09-01 LAB — CBC WITH DIFFERENTIAL/PLATELET
BASOS ABS: 0 {cells}/uL (ref 0–200)
Basophils Relative: 0 %
Eosinophils Absolute: 0 cells/uL — ABNORMAL LOW (ref 15–500)
Eosinophils Relative: 0 %
HEMATOCRIT: 37.9 % (ref 35.0–45.0)
HEMOGLOBIN: 12.3 g/dL (ref 11.7–15.5)
LYMPHS PCT: 28 %
Lymphs Abs: 2436 cells/uL (ref 850–3900)
MCH: 29.6 pg (ref 27.0–33.0)
MCHC: 32.5 g/dL (ref 32.0–36.0)
MCV: 91.3 fL (ref 80.0–100.0)
MONO ABS: 522 {cells}/uL (ref 200–950)
MPV: 10.5 fL (ref 7.5–12.5)
Monocytes Relative: 6 %
NEUTROS PCT: 66 %
Neutro Abs: 5742 cells/uL (ref 1500–7800)
Platelets: 341 10*3/uL (ref 140–400)
RBC: 4.15 MIL/uL (ref 3.80–5.10)
RDW: 14.3 % (ref 11.0–15.0)
WBC: 8.7 10*3/uL (ref 4.0–10.5)

## 2016-09-01 LAB — COMPREHENSIVE METABOLIC PANEL
ALBUMIN: 3.9 g/dL (ref 3.6–5.1)
ALT: 8 U/L (ref 6–29)
AST: 13 U/L (ref 10–30)
Alkaline Phosphatase: 91 U/L (ref 33–115)
BILIRUBIN TOTAL: 0.3 mg/dL (ref 0.2–1.2)
BUN: 11 mg/dL (ref 7–25)
CALCIUM: 9 mg/dL (ref 8.6–10.2)
CO2: 26 mmol/L (ref 20–31)
Chloride: 110 mmol/L (ref 98–110)
Creat: 0.78 mg/dL (ref 0.50–1.10)
Glucose, Bld: 78 mg/dL (ref 65–99)
Potassium: 4.5 mmol/L (ref 3.5–5.3)
SODIUM: 143 mmol/L (ref 135–146)
TOTAL PROTEIN: 7 g/dL (ref 6.1–8.1)

## 2016-09-01 NOTE — Progress Notes (Signed)
Chief Complaint  Patient presents with  . Hypertension    went to GYN 3 weeks ago and bp was high. Has been checking at school and getting numbers around 160/100, 130/90's. Just doesn't feel good;nauseous, HA, blurred vision and fatigue.    BP has been high--140/100 at her GYN visit 3 weeks ago.  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  Highest she saw was 160/100, in the middle of the day when stressed. Yesterday she had a headache, some blurred vision.  She scheduled visit today to evaluate these symptoms.  She doesn't feel any better today--has headache, blurry vision, decreased appetite, some nausea.  She is very tired today, slept most of yesterday and today. Hasn't had anything to eat, just water today. +thirsty, denies polyuria (feels a little dehydrated).  Has been working 2 jobs, 7:30am to 11pm (W-Fri, 2-10 on Friday, off Sat/Sun). Picked up the second job to help with finances (change in husband's job, traveling for cheer competitions, doesn't have income over the summer when not working.)  +work stress. Denies any change in diet, no high sodium foods. Exercise:  Limited by working 2 jobs. She tries to walk at least 30 minutes daily. (has a track she can walk on her lunch break and before going home).  Recently had another abnormal pap, colpo is scheduled for Monday. h/o ablation.  Cycles are very light. h/o iron deficiency anemia and Vit D deficiency.  Vit D 03/2015 was 17, up to 24 in 07/2015.  Hasn't been taking any vitamin D supplements since completing the last prescription.  PMH, PSH, SH reviewed  Outpatient Encounter Prescriptions as of 09/01/2016  Medication Sig  . ibuprofen (ADVIL,MOTRIN) 200 MG tablet Take 400 mg by mouth every 6 (six) hours as needed for moderate pain.  . polyethylene glycol (MIRALAX / GLYCOLAX) packet Take 17 g by mouth daily. (Patient not taking: Reported on 09/01/2016)  . [DISCONTINUED] ferrous sulfate 325 (65 FE)  MG tablet Take 1 tablet (325 mg total) by mouth 2 (two) times daily with a meal.  . [DISCONTINUED] ibuprofen (ADVIL,MOTRIN) 800 MG tablet Take 1 tablet (800 mg total) by mouth every 8 (eight) hours as needed. (Patient not taking: Reported on 07/18/2015)  . [DISCONTINUED] Vitamin D, Ergocalciferol, (DRISDOL) 50000 UNITS CAPS capsule Take 1 capsule (50,000 Units total) by mouth every 7 (seven) days. (Patient not taking: Reported on 01/24/2016)   No facility-administered encounter medications on file as of 09/01/2016.    No Known Allergies  ROS:  +fatigue, headache and blurred vision, with some decreased appetite, as per HPI. No diarrhea, fever, chills.  +sneezing/allergies, no other URI symptoms or sinus pain. Tingling in hands.  Types a lot.  Hands fall asleep frequently at work, relieved by shaking them out.  Not new, ongoing for a while. No chest pain, palpitations, shortness of breath.  No bleeding, bruising, rash.  +stress, denies depression  PHYSICAL EXAM:  BP (!) 150/88 (BP Location: Left Arm, Patient Position: Sitting, Cuff Size: Normal)   Pulse 84   Ht 5' 7.5" (1.715 m)   Wt 240 lb 3.2 oz (109 kg)   LMP 08/09/2016 (Approximate)   BMI 37.07 kg/m   124/80 on repeat by MD  Well developed, pleasant, well-appearing female in no distress HEENT: PERRL, EOMI, conjunctiva and sclera are clear.  TM's and EAC's normal.  Nasal mucosa is moderately edematous, no erythema or drainage.  Sinuses nontender. OP is clear Neck: no lymphadenopathy, thyromegaly or bruit Heart:  regular rate and rhythm, no murmur Lungs: clear bilaterally Back: no spinal or CVA tenderness Abdomen: soft, no mass.  Mildly tender in epigastrium.  No rebound or guarding, no organomegaly or mass Extremities: no edema, normal pulses Psych: normal mood, affect, hygiene and grooming Neuro: alert and oriented, cranial nerves intact, normal strength, gait   ASSESSMENT/PLAN:  Other fatigue - Vit D deficiency likely  contributing, but suspect allergies +/- viral syndrome developing - Plan: CBC with Differential/Platelet, VITAMIN D 25 Hydroxy (Vit-D Deficiency, Fractures), TSH, Hemoglobin A1c, Comprehensive metabolic panel  Elevated blood pressure, situational - reassured BP normal on recheck.  reviewed low sodium diet, weight loss, stress reduction - Plan: Comprehensive metabolic panel  Blurred vision - ?related to allergies, fatigue. r/o DM. f/u with ophtho if persists - Plan: Hemoglobin A1c  Obesity, Class II, BMI 35-39.9 - Plan: Hemoglobin A1c  Vitamin D deficiency - noncompliant with supplements, suspect it will be low again. Discussed need for long-term supplementation - Plan: VITAMIN D 25 Hydroxy (Vit-D Deficiency, Fractures), Vitamin D, Ergocalciferol, (DRISDOL) 50000 units CAPS capsule  Mild epigastric tenderness--unsure if this is going to develop into GI virus. Discussed OTC meds she can use prn.  Educated re: low sodium diet, stress reduction.  Continue to monitor BP (not just when stressed, which will make her more anxious to see high numbers).  f/u if symptoms persist/worsen, or new symptoms develop

## 2016-09-01 NOTE — Patient Instructions (Signed)
Your blood pressure was elevated initially, but normal (124/80) when rechecked. Continue to follow a low sodium diet and get regular exercise. Work on weight loss.  I'm sure that stress and lack of sleep is contributing to your blood pressures. With it normal today, I don't think medications are needed at this point.  Continue to monitor and work on stress reduction.  Regarding your feeling worse for 2 days--allergies can contribute.  It might be due to inadequate sleep catching up to you, which can also decrease your immune system and put you at risk for getting other illnesses.  Other than allergies, I don't see anything wrong today, but wouldn't be surprised if you came down with either a GI illness or a cold.  Your were a little tender over your stomach.  Along with your nausea, consider taking a medication like Mylanta, Maalox or Pepto Bismol if needed for your stomach.  Eat something--make it bland and easy to digest.

## 2016-09-02 LAB — HEMOGLOBIN A1C
Hgb A1c MFr Bld: 5.4 % (ref <5.7)
Mean Plasma Glucose: 108 mg/dL

## 2016-09-02 LAB — VITAMIN D 25 HYDROXY (VIT D DEFICIENCY, FRACTURES): Vit D, 25-Hydroxy: 14 ng/mL — ABNORMAL LOW (ref 30–100)

## 2016-09-02 LAB — TSH: TSH: 0.91 m[IU]/L

## 2016-09-02 MED ORDER — VITAMIN D (ERGOCALCIFEROL) 1.25 MG (50000 UNIT) PO CAPS: 50000.0000 [IU] | ORAL_CAPSULE | ORAL | 0 refills | Status: DC

## 2016-11-11 ENCOUNTER — Ambulatory Visit (INDEPENDENT_AMBULATORY_CARE_PROVIDER_SITE_OTHER): Payer: BC Managed Care – PPO | Admitting: Family Medicine

## 2016-11-11 ENCOUNTER — Encounter: Payer: Self-pay | Admitting: Family Medicine

## 2016-11-11 VITALS — BP 128/76 | HR 84 | Temp 98.5°F | Wt 250.8 lb

## 2016-11-11 DIAGNOSIS — R6883 Chills (without fever): Secondary | ICD-10-CM

## 2016-11-11 DIAGNOSIS — K5900 Constipation, unspecified: Secondary | ICD-10-CM

## 2016-11-11 DIAGNOSIS — R109 Unspecified abdominal pain: Secondary | ICD-10-CM | POA: Diagnosis not present

## 2016-11-11 DIAGNOSIS — R52 Pain, unspecified: Secondary | ICD-10-CM | POA: Diagnosis not present

## 2016-11-11 LAB — POCT URINALYSIS DIP (PROADVANTAGE DEVICE)
BILIRUBIN UA: NEGATIVE
BILIRUBIN UA: NEGATIVE mg/dL
Glucose, UA: NEGATIVE mg/dL
Nitrite, UA: NEGATIVE
PH UA: 6 (ref 5.0–8.0)
Specific Gravity, Urine: 1.025
Urobilinogen, Ur: POSITIVE

## 2016-11-11 NOTE — Patient Instructions (Signed)
I suspect you have a viral illness and recommend supportive therapy at this time.   Stay hydrated. Take Tylenol or Ibuprofen as needed for side pain or body aches.  You may want to take stool softeners or miralax for your bowels.   If you notice any new or worsening symptoms then call me.

## 2016-11-11 NOTE — Progress Notes (Addendum)
Subjective:    Patient ID: Karla [name], female    DOB: [birth year], 36 y.o.   MRN: [id number]  HPI Chief Complaint  Patient presents with  . bodyaches, chills,backaches    bodyaches, chills, back pain started sunday    She is here with complaints of a 3 day history of chills, body aches, and intermittent left side pain that radiates around to her left lower back. Pain is sharp and worse with movements. Pain is improved with rest. Pain is not aggravated by urination.   States she started her period today.   Denies fever, headache, dizziness, ear pain, URI symptoms, chest pain, palpitations, shortness of breath, cough, abdominal pain, vomiting, diarrhea, urinary frequency, urgency, dysuria.  No history of kidney stones or recurrent UTIs.   Has not taken anything for her symptoms.   Last bowel movement was 4 days ago and "hard". States she had to strain. Feels constipated. Sometimes has to drink prune juice or apple juice to help her but she has not done this. States normally she has 1-2 bowel movements per week.   She is taking vitamin D daily and fatigue has improved.   Reviewed allergies, medications, past medical, surgical, and social history.    Review of Systems Pertinent positives and negatives in the history of present illness.     Objective:   Physical Exam  Constitutional: She is oriented to person, place, and time. She appears well-developed and well-nourished. No distress.  HENT:  Mouth/Throat: Oropharynx is clear and moist.  Eyes: Conjunctivae and EOM are normal. Pupils are equal, round, and reactive to light.  Neck: Normal range of motion. Neck supple. No thyromegaly present.  Cardiovascular: Normal rate, regular rhythm, normal heart sounds and intact distal pulses.  Exam reveals no gallop and no friction rub.   No murmur heard. Pulmonary/Chest: Effort normal and breath sounds normal.  Abdominal: Soft. Normal appearance and bowel sounds are normal. She exhibits  no distension. There is no hepatosplenomegaly. There is no tenderness. There is no rigidity, no rebound, no guarding, no CVA tenderness, no tenderness at McBurney's point and negative Murphy's sign.  Musculoskeletal: Normal range of motion.       Lumbar back: Normal.  Negative straight leg raise  Lymphadenopathy:    She has no cervical adenopathy.  Neurological: She is alert and oriented to person, place, and time. She has normal strength. Gait normal.  Skin: Skin is warm and dry. No rash noted. No erythema. No pallor.  Psychiatric: She has a normal mood and affect. Her behavior is normal. Thought content normal.   BP 128/76   Pulse 84   Temp 98.5 F (36.9 C)   Wt 250 lb 12.8 oz (113.8 kg)   SpO2 98%   BMI 38.70 kg/m       Assessment & Plan:  Body aches  Chills (without fever) - Plan: POCT Urinalysis DIP (Proadvantage Device), Urine Culture  Constipation, unspecified constipation type  Side pain - Plan: POCT Urinalysis DIP (Proadvantage Device), Urine Culture  Urinalysis dipstick: spec grav 1.025, leu 3+, blood 3+ (menses) Discussed that she appears to have a viral illness. Suspect her left side pain is related to a MSK etiology. Pain with movement speaks to this. No sign of an acute infectious process. Will culture urine and treat as appropriate.  Recommend trying stool softeners and miralax for constipation but she is not far from her baseline bowel function since she generally has 1-2 bowel movements per week.  Recommend supportive therapy  at this time and she will call me with any new or worsening symptoms.

## 2016-11-11 NOTE — Addendum Note (Signed)
Addended by: Avanell ShackletonHENSON, VICKIE L on: 11/11/2016 02:32 PM   Modules accepted: Orders

## 2016-11-12 LAB — URINE CULTURE

## 2017-01-16 ENCOUNTER — Ambulatory Visit (INDEPENDENT_AMBULATORY_CARE_PROVIDER_SITE_OTHER): Payer: BC Managed Care – PPO | Admitting: Family Medicine

## 2017-01-16 ENCOUNTER — Encounter: Payer: Self-pay | Admitting: Family Medicine

## 2017-01-16 VITALS — BP 140/80 | HR 80 | Resp 24 | Wt 250.0 lb

## 2017-01-16 DIAGNOSIS — F4321 Adjustment disorder with depressed mood: Secondary | ICD-10-CM

## 2017-01-16 DIAGNOSIS — F4320 Adjustment disorder, unspecified: Secondary | ICD-10-CM | POA: Diagnosis not present

## 2017-01-16 MED ORDER — ALPRAZOLAM 0.25 MG PO TABS: 0.2500 mg | ORAL_TABLET | Freq: Three times a day (TID) | ORAL | 0 refills | Status: DC | PRN

## 2017-01-16 NOTE — Patient Instructions (Signed)
   Alprazolam 0.25mg  Take 1 tablet by mouth every 8 hours as needed for anxiety. This may be somewhat sedating, so use with caution if driving. If 1 tablet has no effect after 20 minutes, you may take a second (with increased risk of sedation). At bedtime, you may take 2 from the onset, if you are having trouble shutting your mind down to sleep. Do not mix with alcohol. Please call Hospice to get hooked up with Grief Counseling. You probably should also look into your Employee Assistance Program through your job for counseling/assistance.  Return to further discuss moods and medications if suggested by the therapists, or if depression or anxiety is worsening rather than improving with time.  Call your gynecologist regarding the possible recurrent bacterial vaginosis (since they have evaluated and treated you for this in the past).

## 2017-01-16 NOTE — Progress Notes (Signed)
Chief Complaint  Patient presents with  . Depression    dad died 2 days ago. can't sleep, vaginal discharge.    Patient presents with complaint of crying and insomnia since her father passed away.    Her father was diagnosed with cancer last August (lining of kidneys), spread to lungs, back.  He had a chemo treatment Thursday, said afterwards that he was "transitioning".  He went on Hospice on Friday, went to [facility] on Saturday.  She recalls that he was talking/responsive through Monday.  Raspy breathing started Tuesday, rapidly declined, was kept comfortable.  She was there for his last breath (12:56 Wednesday morning), along with her mother. She moved in with her parents to help (end of April)  H/o anxiety and depression in the past. 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  Former Herbalist for day treatment program. +life stressors contributing to anxiety/depression in the past included divorce, issues with children issues.  Currently works at [employer] at [employer], Neurosurgeon. She was given 10 days off, ultimately will get FMLA (started by hospice doctor).  Today she describes that she can't walk into the house without crying, feels like she can't breathe, can't sleep. Tried melatonin and unisom without much benefit.  PMH, PSH, SH reviewed  Outpatient Encounter Prescriptions as of 01/16/2017  Medication Sig Note  . ALPRAZolam (XANAX) 0.25 MG tablet Take 1-2 tablets (0.25-0.5 mg total) by mouth 3 (three) times daily as needed for anxiety or sleep.   Marland Kitchen ibuprofen (ADVIL,MOTRIN) 200 MG tablet Take 400 mg by mouth every 6 (six) hours as needed for moderate pain.   . Vitamin D, Ergocalciferol, (DRISDOL) 50000 units CAPS capsule Take 1 capsule (50,000 Units total) by mouth every 7 (seven) days. (Patient not taking: Reported on 01/16/2017) 01/16/2017: Completed course; taking OTC now   No facility-administered encounter  medications on file as of 01/16/2017.    (alprazolam rx'd today, not taking prior to visit)  No Known Allergies  ROS:  No fever, chills, URI symptoms, chest pain, shortness of breath (except related to anxiety/crying as described above).  +depression/anxiety/insomnia as above. She is also reporting (at end of visit) vaginal discharge and odor after her cycles.  Douching is the only that has helped. (has prev seen GYN with similar complaints).  No urinary complaints. No bleeding, bruising, rash, edema or other complaints.  PHYSICAL EXAM:  BP 140/80   Pulse 80   Resp (!) 24   Wt 250 lb (113.4 kg)   SpO2 98%   BMI 38.58 kg/m   Pleasant female, in no distress. She got slightly tearful at one point during visit, but most of visit she was able to hold it together.  She has depressed mood, normal hygiene, grooming, eye contact and speech  PHQ-9 score of 16--see full screen in epic  ASSESSMENT/PLAN:  Grief reaction - Plan: ALPRAZolam (XANAX) 0.25 MG tablet   25-30 min visit, more than 1/2 spent counseling. Some grief counseling provided Encouraged her to seek grief counseling through Hospice, and also look into her EAP. She has managed her depression/anxiety quite well in the past without meds, so at this point elected to start with alprazolam to use prn (at bedtime, to help sleep and prn during day), and holding off on preventative daily med (sounded as though she may be interested in this, and may potentially be needed in future). Reviewed risks/side effects of benzo in detail.  Vaginal complaint not addressed today--advised  to f/u with her GYN (or a separate visit here)   Alprazolam 0.25mg  Take 1 tablet by mouth every 8 hours as needed for anxiety. This may be somewhat sedating, so use with caution if driving. If 1 tablet has no effect after 20 minutes, you may take a second (with increased risk of sedation). At bedtime, you may take 2 from the onset, if you are having trouble  shutting your mind down to sleep. Do not mix with alcohol. Please call Hospice to get hooked up with Grief Counseling. You probably should also look into your Employee Assistance Program through your job for counseling/assistance.  Return to further discuss moods and medications if suggested by the therapists, or if depression or anxiety is worsening rather than improving with time.  Call your gynecologist regarding the possible recurrent bacterial vaginosis (since they have evaluated and treated you for this in the past).

## 2017-01-18 ENCOUNTER — Encounter: Payer: Self-pay | Admitting: Family Medicine

## 2017-04-12 ENCOUNTER — Encounter (HOSPITAL_COMMUNITY): Payer: Self-pay | Admitting: *Deleted

## 2017-04-12 ENCOUNTER — Other Ambulatory Visit: Payer: Self-pay

## 2017-04-12 ENCOUNTER — Emergency Department (HOSPITAL_COMMUNITY): Payer: BC Managed Care – PPO

## 2017-04-12 ENCOUNTER — Emergency Department (HOSPITAL_COMMUNITY)
Admission: EM | Admit: 2017-04-12 | Discharge: 2017-04-13 | Disposition: A | Discharge status: Home / Self Care | Payer: BC Managed Care – PPO | Attending: Emergency Medicine | Admitting: Emergency Medicine

## 2017-04-12 DIAGNOSIS — M25562 Pain in left knee: Secondary | ICD-10-CM | POA: Diagnosis not present

## 2017-04-12 DIAGNOSIS — Z79899 Other long term (current) drug therapy: Secondary | ICD-10-CM | POA: Insufficient documentation

## 2017-04-12 MED ORDER — HYDROCODONE-ACETAMINOPHEN 5-325 MG PO TABS
2.0000 | ORAL_TABLET | Freq: Once | ORAL | Status: AC
  Administered 2017-04-12: 2 via ORAL
  Filled 2017-04-12: qty 2

## 2017-04-12 MED ORDER — IBUPROFEN 800 MG PO TABS
800.0000 mg | ORAL_TABLET | Freq: Three times a day (TID) | ORAL | 0 refills | Status: DC
Start: 2017-04-12 — End: 2017-04-21

## 2017-04-12 NOTE — ED Triage Notes (Signed)
Pt c/o left knee pain that worsened today after hearing a pop.  Pt denies injury/trauma.

## 2017-04-12 NOTE — ED Provider Notes (Signed)
[facility]-EMERGENCY DEPT Provider Note   CSN: [id number] Arrival date & time: 04/12/17  2051     History   Chief Complaint Chief Complaint  Patient presents with  . Knee Pain    right    HPI Karla [name] is a 36 y.o. female.  Patient presents to the ED with a chief complaint of left knee pain.  She states that she has chronic left knee pain, but reports that the pain worsened today.  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  She complains of swelling and increased pain.  She has tried aleve with no relief.  She denies any other associated symptoms.  She states that she has seen an orthopedic, who said that there is nothing they can do.   The history is provided by the patient. No language interpreter was used.    Past Medical History:  Diagnosis Date  . Abnormal Pap smear   . GERD (gastroesophageal reflux disease)   . H/O transfusion of packed red blood cells   . Herpes   . Iron deficiency anemia    since 36 y.o.      Patient Active Problem List   Diagnosis Date Noted  . Vitamin D deficiency 08/01/2015  . Abnormal weight gain 08/01/2015  . Abnormal Pap smear of cervix 08/01/2015  . Symptomatic anemia 01/29/2015  . Anemia, iron deficiency 01/29/2015    Past Surgical History:  Procedure Laterality Date  . Uterine ablation      OB History    Gravida Para Term Preterm AB Living   3 3 3     3    SAB TAB Ectopic Multiple Live Births           3       Home Medications    Prior to Admission medications   Medication Sig Start Date End Date Taking? Authorizing Provider  ALPRAZolam (XANAX) 0.25 MG tablet Take 1-2 tablets (0.25-0.5 mg total) by mouth 3 (three) times daily as needed for anxiety or sleep. 01/16/17   Joselyn ArrowKnapp, Eve, MD  ibuprofen (ADVIL,MOTRIN) 200 MG tablet Take 400 mg by mouth every 6 (six) hours as needed for moderate pain.    [provider]  Vitamin D, Ergocalciferol, (DRISDOL) 50000  units CAPS capsule Take 1 capsule (50,000 Units total) by mouth every 7 (seven) days. Patient not taking: Reported on 01/16/2017 09/02/16   Joselyn ArrowKnapp, Eve, MD    Family History Family History  Problem Relation Age of Onset  . Diabetes Father   . Cancer Father        of lining of kidneys, spread to lungs, bones  . Diabetes Maternal Grandmother   . Hypertension Maternal Grandmother   . Diabetes Maternal Grandfather   . Anemia Paternal Grandmother   . Cancer Paternal Grandfather        prostate  . Anemia Cousin     Social History Social History   Tobacco Use  . Smoking status: Never Smoker  . Smokeless tobacco: Never Used  Substance Use Topics  . Alcohol use: Yes    Comment: ocassionally  . Drug use: No     Allergies   Patient has no known allergies.   Review of Systems Review of Systems  All other systems reviewed and are negative.    Physical Exam Updated Vital Signs BP (!) 158/95 (BP Location: Right Arm)   Pulse 90   Temp 98.2 F (36.8 C) (Oral)  Resp 18   Ht 5\' 8"  (1.727 m)   Wt 113.4 kg (250 lb)   LMP 04/04/2017 (Approximate) Comment: Pt stated "it comes but it's more like spotting"  SpO2 99%   BMI 38.01 kg/m   Physical Exam Nursing note and vitals reviewed.  Constitutional: Pt appears well-developed and well-nourished. No distress.  HENT:  Head: Normocephalic and atraumatic.  Eyes: Conjunctivae are normal.  Neck: Normal range of motion.  Cardiovascular: Normal rate, regular rhythm. Intact distal pulses.   Capillary refill < 3 sec.  Pulmonary/Chest: Effort normal and breath sounds normal.  Musculoskeletal:  Left lower extremity Pt exhibits TTP about the left knee with moderate swelling, no bony abnormality or deformity.   ROM: 4/5 limited by pain  Strength: 4/5 limited by pain  Neurological: Pt  is alert. Coordination normal.  Sensation: 5/5 Skin: Skin is warm and dry. Pt is not diaphoretic.  No evidence of open wound or skin  tenting Psychiatric: Pt has a normal mood and affect.     ED Treatments / Results  Labs (all labs ordered are listed, but only abnormal results are displayed) Labs Reviewed - No data to display  EKG  EKG Interpretation None       Radiology Dg Knee Complete 4 Views Left  Result Date: 04/12/2017 CLINICAL DATA:  36 y/o  F; left knee popping sensation and pain. EXAM: LEFT KNEE - COMPLETE 4+ VIEW COMPARISON:  04/06/2015 left knee radiographs FINDINGS: No acute fracture or dislocation. No joint effusion. Mild stable tricompartmental osteoarthrosis with small periarticular osteophytes and spurring of tibial spines. IMPRESSION: 1. No acute fracture or dislocation. 2. Stable mild tricompartmental osteoarthrosis. Electronically Signed   By: Mitzi HansenLance  Furusawa-Stratton M.D.   On: 04/12/2017 23:54    Procedures Procedures (including critical care time)  Medications Ordered in ED Medications - No data to display   Initial Impression / Assessment and Plan / ED Course  I have reviewed the triage vital signs and the nursing notes.  Pertinent labs & imaging results that were available during my care of the patient were reviewed by me and considered in my medical decision making (see chart for details).     Patient X-Ray negative for obvious fracture or dislocation.  Pt advised to follow up with orthopedics. Patient given knee immobilizer and crutches while in ED, conservative therapy recommended and discussed. Patient will be discharged home & is agreeable with above plan. Returns precautions discussed. Pt appears safe for discharge.   Final Clinical Impressions(s) / ED Diagnoses   Final diagnoses:  Acute pain of left knee    ED Discharge Orders        Ordered    ibuprofen (ADVIL,MOTRIN) 800 MG tablet  3 times daily     04/12/17 2358       Roxy HorsemanBrowning, Shakera Ebrahimi, PA-C 04/12/17 2359    Charlynne PanderYao, David Hsienta, MD 04/14/17 [phone]

## 2017-04-21 ENCOUNTER — Encounter: Payer: Self-pay | Admitting: Family Medicine

## 2017-04-21 ENCOUNTER — Ambulatory Visit: Payer: BC Managed Care – PPO | Admitting: Family Medicine

## 2017-04-21 VITALS — BP 124/70 | HR 93 | Wt 264.6 lb

## 2017-04-21 DIAGNOSIS — M5441 Lumbago with sciatica, right side: Secondary | ICD-10-CM | POA: Diagnosis not present

## 2017-04-21 NOTE — Patient Instructions (Signed)
Take 2 Aleve twice daily with food. Stop the ibuprofen.   Use heat to the area and do some gentle stretching.   See your orthopedist as scheduled for this as well.   We will call you with your XR result.   Sciatica Sciatica is pain, numbness, weakness, or tingling along your sciatic nerve. The sciatic nerve starts in the lower back and goes down the back of each leg. Sciatica happens when this nerve is pinched or has pressure put on it. Sciatica usually goes away on its own or with treatment. Sometimes, sciatica may keep coming back (recur). Follow these instructions at home: Medicines  Take over-the-counter and prescription medicines only as told by your doctor.  Do not drive or use heavy machinery while taking prescription pain medicine. Managing pain  If directed, put ice on the affected area. ? Put ice in a plastic bag. ? Place a towel between your skin and the bag. ? Leave the ice on for 20 minutes, 2-3 times a day.  After icing, apply heat to the affected area before you exercise or as often as told by your doctor. Use the heat source that your doctor tells you to use, such as a moist heat pack or a heating pad. ? Place a towel between your skin and the heat source. ? Leave the heat on for 20-30 minutes. ? Remove the heat if your skin turns bright red. This is especially important if you are unable to feel pain, heat, or cold. You may have a greater risk of getting burned. Activity  Return to your normal activities as told by your doctor. Ask your doctor what activities are safe for you. ? Avoid activities that make your sciatica worse.  Take short rests during the day. Rest in a lying or standing position. This is usually better than sitting to rest. ? When you rest for a long time, do some physical activity or stretching between periods of rest. ? Avoid sitting for a long time without moving. Get up and move around at least one time each hour.  Exercise and stretch  regularly, as told by your doctor.  Do not lift anything that is heavier than 10 lb (4.5 kg) while you have symptoms of sciatica. ? Avoid lifting heavy things even when you do not have symptoms. ? Avoid lifting heavy things over and over.  When you lift objects, always lift in a way that is safe for your body. To do this, you should: ? Bend your knees. ? Keep the object close to your body. ? Avoid twisting. General instructions  Use good posture. ? Avoid leaning forward when you are sitting. ? Avoid hunching over when you are standing.  Stay at a healthy weight.  Wear comfortable shoes that support your feet. Avoid wearing high heels.  Avoid sleeping on a mattress that is too soft or too hard. You might have less pain if you sleep on a mattress that is firm enough to support your back.  Keep all follow-up visits as told by your doctor. This is important. Contact a doctor if:  You have pain that: ? Wakes you up when you are sleeping. ? Gets worse when you lie down. ? Is worse than the pain you have had in the past. ? Lasts longer than 4 weeks.  You lose weight for without trying. Get help right away if:  You cannot control when you pee (urinate) or poop (have a bowel movement).  You have weakness in  any of these areas and it gets worse. ? Lower back. ? Lower belly (pelvis). ? Butt (buttocks). ? Legs.  You have redness or swelling of your back.  You have a burning feeling when you pee. This information is not intended to replace advice given to you by your health care provider. Make sure you discuss any questions you have with your health care provider. Document Released: 02/26/2008 Document Revised: 10/25/2015 Document Reviewed: 01/26/2015 Elsevier Interactive Patient Education  [name].

## 2017-04-21 NOTE — Progress Notes (Signed)
   Subjective:    Patient ID: Karla [name], female    DOB: February 19, 1981, 36 y.o.   MRN: [id number]  HPI Chief Complaint  Patient presents with  . having pain and leg    having numbness and tingle on right side , possible pinch nerve , pain in her hip    She is here with complaints of right low back, hip and knee pain for the past 3 days after putting extra weight on her right leg due to recent left knee injury. She is taking 800 mg of ibuprofen 1-2 times daily with some relief. Right low back pain that shoots down her right posterior leg to her knee. Pain is worse when going from sitting to standing.  States her right knee is not locking or catching.   She is wearing a knee immobilizer on her left knee. Was seen in the ED for acute knee pain and had a negative XR and told to follow up with ortho but she has not. States she has an appointment next week with [facility] [facility].   She is seeing a counselor for unresolved grief after her father passed. She is tearful when talking about this. She is living with her mother for now. States she has a brother but does not talk to him very often.    Denies fever, chills, chest pain, palpitations, fatigue, unexplained weight loss. No loss of control of bowels or bladder. No saddle anesthesia.   Reviewed allergies, medications, past medical, surgical,  and social history.   Review of Systems Pertinent positives and negatives in the history of present illness.     Objective:   Physical Exam BP 124/70   Pulse 93   Wt 264 lb 9.6 oz (120 kg)   LMP 04/04/2017 (Approximate) Comment: Pt stated "it comes but it's more like spotting"  SpO2 96%   BMI 40.23 kg/m   Alert and in no distress. Neck is supple without adenopathy or thyromegaly. Cardiac exam shows a regular sinus rhythm without murmurs or gallops. Lungs are clear to auscultation. Cervical and thoracic spine normal. Lumbar spine with normal ROM. nontender spine. Mild tenderness over right sciatic notch.  Sensation and motor function normal. Straight leg raise pos at about 45 degrees. DTRs normal and symmetric.       Assessment & Plan:  Acute right-sided low back pain with right-sided sciatica - Plan: DG Lumbar Spine Complete  She appears to have sciatica. Will send her for an XR Lumbar Spine. She will stop ibuprofen and start taking 2 Aleve twice daily. She will also use heat. Handout given on sciatica and gentle stretches demonstrated.  She is seeing [facility] [facility] next week for left knee pain and will also discuss this issue with them.

## 2017-04-22 ENCOUNTER — Ambulatory Visit
Admission: RE | Admit: 2017-04-22 | Discharge: 2017-04-22 | Disposition: A | Discharge status: Home / Self Care | Payer: BC Managed Care – PPO | Source: Ambulatory Visit | Attending: Family Medicine | Admitting: Family Medicine

## 2017-04-22 DIAGNOSIS — M5441 Lumbago with sciatica, right side: Secondary | ICD-10-CM

## 2017-09-29 ENCOUNTER — Ambulatory Visit: Payer: BC Managed Care – PPO | Admitting: Medical

## 2017-09-29 ENCOUNTER — Encounter: Payer: Self-pay | Admitting: Medical

## 2017-09-29 VITALS — BP 138/80 | HR 87 | Temp 98.4°F | Ht 66.5 in | Wt 257.2 lb

## 2017-09-29 DIAGNOSIS — J029 Acute pharyngitis, unspecified: Secondary | ICD-10-CM | POA: Diagnosis not present

## 2017-09-29 LAB — POCT RAPID STREP A (OFFICE): Rapid Strep A Screen: NEGATIVE

## 2017-09-29 NOTE — Patient Instructions (Signed)
Recommendations:  You can use salt water gargles  Continue Chloraseptic spray  Begin OTC Ibuprofen 3 tablets up to 3 times per day this week  Continue allergy pill such as zyrtec at bedtime for the next 1-2 weeks  Drink plenty of water  You can use honey, tea, and lemon beverage to sooth the throat  Symptoms should gradually improve through the week  If much worse in the next few days, of if fever over 101, much more swollen tonsils, etc, then call back

## 2017-09-29 NOTE — Progress Notes (Signed)
  Subjective: Karla [name] is a 37 y.o. female who presents for evaluation of sore throat x 4 days, swollen glands.  No fever, no headache.  Has some sneezing, but no itchy eyes, no runny nose.  Using some allergy medication, tylenol, chloraseptic spray, but not improving.  No cough, no congestion.   There was a strep contact at son's daycare, but she wasn't directly around them.   No other aggravating or relieving factors.  No other c/o.  The following portions of the patient's history were reviewed and updated as appropriate: allergies, current medications, past medical history, past social history, past surgical history and problem list.  Past Medical History:  Diagnosis Date  . Abnormal Pap smear   . GERD (gastroesophageal reflux disease)   . H/O transfusion of packed red blood cells   . Herpes   . Iron deficiency anemia    since 37 y.o.     No current outpatient medications on file prior to visit.   No current facility-administered medications on file prior to visit.     ROS as in subjective    Objective: BP 138/80   Pulse 87   Temp 98.4 F (36.9 C) (Oral)   Ht 5' 6.5" (1.689 m)   Wt 257 lb 3.2 oz (116.7 kg)   SpO2 99%   BMI 40.89 kg/m   General appearance: no distress, WD/WN,mildly ill-appearing HEENT: normocephalic, conjunctiva/corneas normal, sclerae anicteric, nares patent, no discharge or erythema, pharynx with mild erythema, no exudate.  Oral cavity: MMM, no lesions  Neck: supple, shoddy tender anterior nodes, no thyromegaly Lungs: CTA bilaterally, no wheezes, rhonchi, or rales  Laboratory Strep test done. Results:negative.    Assessment: Encounter Diagnosis  Name Primary?  . Viral pharyngitis Yes     Plan: Strep negative.  Discussed symptoms, exam findings, supportive care and usual time frame to see improvements.  Patient Instructions  Recommendations:  You can use salt water gargles  Continue Chloraseptic spray  Begin OTC Ibuprofen 3 tablets  up to 3 times per day this week  Continue allergy pill such as zyrtec at bedtime for the next 1-2 weeks  Drink plenty of water  You can use honey, tea, and lemon beverage to sooth the throat  Symptoms should gradually improve through the week  If much worse in the next few days, of if fever over 101, much more swollen tonsils, etc, then call back

## 2017-09-29 NOTE — Addendum Note (Signed)
Addended by: Victorio Palm on: 09/29/2017 08:44 AM   Modules accepted: Orders

## 2018-01-25 ENCOUNTER — Ambulatory Visit: Payer: BC Managed Care – PPO | Admitting: Family Medicine

## 2018-01-25 ENCOUNTER — Encounter: Payer: Self-pay | Admitting: Family Medicine

## 2018-01-25 VITALS — BP 128/80 | HR 84 | Temp 98.5°F | Resp 16 | Wt 260.0 lb

## 2018-01-25 DIAGNOSIS — R42 Dizziness and giddiness: Secondary | ICD-10-CM

## 2018-01-25 DIAGNOSIS — R058 Other specified cough: Secondary | ICD-10-CM

## 2018-01-25 DIAGNOSIS — R7303 Prediabetes: Secondary | ICD-10-CM | POA: Insufficient documentation

## 2018-01-25 DIAGNOSIS — R6889 Other general symptoms and signs: Secondary | ICD-10-CM | POA: Diagnosis not present

## 2018-01-25 DIAGNOSIS — E559 Vitamin D deficiency, unspecified: Secondary | ICD-10-CM

## 2018-01-25 DIAGNOSIS — R5383 Other fatigue: Secondary | ICD-10-CM | POA: Diagnosis not present

## 2018-01-25 DIAGNOSIS — H538 Other visual disturbances: Secondary | ICD-10-CM

## 2018-01-25 DIAGNOSIS — R05 Cough: Secondary | ICD-10-CM

## 2018-01-25 DIAGNOSIS — D509 Iron deficiency anemia, unspecified: Secondary | ICD-10-CM

## 2018-01-25 LAB — POCT HEMOGLOBIN: HEMOGLOBIN: 11 g/dL — AB (ref 12.2–16.2)

## 2018-01-25 LAB — POCT GLYCOSYLATED HEMOGLOBIN (HGB A1C): Hemoglobin A1C: 5.8 % — AB (ref 4.0–5.6)

## 2018-01-25 LAB — POCT URINALYSIS DIP (PROADVANTAGE DEVICE)
BILIRUBIN UA: NEGATIVE
Blood, UA: NEGATIVE
GLUCOSE UA: NEGATIVE mg/dL
Ketones, POC UA: NEGATIVE mg/dL
Nitrite, UA: NEGATIVE
Protein Ur, POC: NEGATIVE mg/dL
Specific Gravity, Urine: 1.025
Urobilinogen, Ur: NEGATIVE
pH, UA: 6 (ref 5.0–8.0)

## 2018-01-25 LAB — POCT CBG (FASTING - GLUCOSE)-MANUAL ENTRY: Glucose Fasting, POC: 141 mg/dL — AB (ref 70–99)

## 2018-01-25 MED ORDER — MECLIZINE HCL 25 MG PO TABS: 25.0000 mg | ORAL_TABLET | Freq: Two times a day (BID) | ORAL | 0 refills | Status: DC | PRN

## 2018-01-25 NOTE — Progress Notes (Signed)
Chief Complaint  Patient presents with  . sick    deep cough, chest congestion, headache, sinus pressure, chills, dizzy, vision blurry- been going on the last week, fatigue    Subjective:  Karla [name] is a 37 y.o. female who presents for a one week history of dizziness and blurry vision that was infrequent initially but becoming worse and more consistent. Dizziness is a spinning sensation with laying down and at times when standing up but also noticed symptoms while standing. Blurred vision associated. No N/V. Dizziness lasts only a few seconds. No chest pain, palpitations, or shortness of breath.  Also complains of a throbbing frontal headache since this morning. Is not the worst headache ever. Reports feeling extremely cold and tired. History of fatigue and similar symptoms with anemia per patient. Has a history of heavy periods and needing a blood transfusion in the past. Periods no longer heavy after ablation. Had a tubal ligation. Taking iron daily.  She also has a dry cough for the past 3-4 days.   No fever, chills, double vision, rhinorrhea, nasal congestion, ear pain, sore throat, chest pain, palpitations, shortness of breath, wheezing, abdominal pain, urinary symptoms. No numbness, tingling or weakness.   She has noticed some constipation, hard stools but no blood. Taking iron daily.  She is wearing contact lenses and visual acuity is decreased in left eye. States she plans to see her eye doctor.   Treatment to date: ibuprofen 600 mg.  Denies sick contacts.  No other aggravating or relieving factors.  No other c/o.  ROS as in subjective.   Objective: Vitals:   01/25/18 1118 01/25/18 1119  BP: 124/80 128/80  Pulse: 76 84  Resp:    Temp:    SpO2:      General appearance: Alert, WD/WN, no distress, mildly ill appearing                             Skin: warm, no rash, slight pallor                            Head: no sinus tenderness                            Eyes:  conjunctiva normal, corneas clear, PERRLA                            Ears: pearly TMs, external ear canals normal                          Nose: septum midline, turbinates normal, without erythema and no discharge             Mouth/throat: MMM, tongue normal, mild pharyngeal erythema, no edema                            Neck: supple, no adenopathy, no thyromegaly, nontender                          Heart: RRR, normal S1, S2, no murmurs                         Lungs: CTA bilaterally, no wheezes, rales, or rhonchi  Extremities: no edema, normal pulses   Neurovascular: PERRLA, EOMs intact, no facial asymmetry, normal finger to nose, heel to shin, CNs intact,     DTRs symmetric and normal. Negative Romberg.          Assessment: Dizziness - Plan: Glucose (CBG), Fasting, HgB A1c, CBC with Differential/Platelet, Comprehensive metabolic panel, POCT Urinalysis DIP (Proadvantage Device), TSH, T4, free, meclizine (ANTIVERT) 25 MG tablet  Other fatigue - Plan: Glucose (CBG), Fasting, HgB A1c, CBC with Differential/Platelet, Comprehensive metabolic panel, POCT Urinalysis DIP (Proadvantage Device), TSH, T4, free, Iron, TIBC and Ferritin Panel, VITAMIN D 25 Hydroxy (Vit-D Deficiency, Fractures), Vitamin B12, POCT hemoglobin  Blurred vision, bilateral - Plan: POCT Urinalysis DIP (Proadvantage Device)  Vitamin D deficiency - Plan: VITAMIN D 25 Hydroxy (Vit-D Deficiency, Fractures)  Iron deficiency anemia, unspecified iron deficiency anemia type - Plan: CBC with Differential/Platelet, Iron, TIBC and Ferritin Panel  Prediabetes - Plan: CBC with Differential/Platelet, Comprehensive metabolic panel, POCT Urinalysis DIP (Proadvantage Device), TSH, T4, free  Dry cough  Cold intolerance - Plan: CBC with Differential/Platelet, Comprehensive metabolic panel, TSH, T4, free, Iron, TIBC and Ferritin Panel, POCT hemoglobin    Plan: UA negative POCT glucose 141 (fasting) Hgb A1c 5.8% POCT Hgb 11.0 She is not  orthostatic.  Suspect her symptoms are related to vertigo vs anemia or a combination. She is hemodynamically stable and no sign of an acute neurological event.  Blurred vision is intermittent. She will see her eye doctor ASAP.  She will try meclizine and change positions slowly. Question hydration status and advised her to increase fluids.  Cough appears unrelated. Try Mucinex.  History of anemia and vitamin D deficiency. Check labs and follow up.  Counseling done on prediabetes. Improving diet and exercise and weight loss to prevent or prolong diabetes.  Work note provided for today and tomorrow. She will follow up Thursday.

## 2018-01-25 NOTE — Patient Instructions (Addendum)
Try the meclizine and see if this helps your symptoms.  Increase your fluid intake.  Change positions slowly.   For your cough, you can try over the counter Mucinex (guafinasen).   We will call you with your lab results.  Schedule to see your eye doctor. Your vision is decreased in your left eye.   Follow up with me Thursday.   Dizziness Dizziness is a common problem. It makes you feel unsteady or light-headed. You may feel like you are about to pass out (faint). Dizziness can lead to getting hurt if you stumble or fall. Dizziness can be caused by many things, including:  Medicines.  Not having enough water in your body (dehydration).  Illness.  Follow these instructions at home: Eating and drinking  Drink enough fluid to keep your pee (urine) clear or pale yellow. This helps to keep you from getting dehydrated. Try to drink more clear fluids, such as water.  Do not drink alcohol.  Limit how much caffeine you drink or eat, if your doctor tells you to do that.  Limit how much salt (sodium) you drink or eat, if your doctor tells you to do that. Activity  Avoid making quick movements. ? When you stand up from sitting in a chair, steady yourself until you feel okay. ? In the morning, first sit up on the side of the bed. When you feel okay, stand slowly while you hold onto something. Do this until you know that your balance is fine.  If you need to stand in one place for a long time, move your legs often. Tighten and relax the muscles in your legs while you are standing.  Do not drive or use heavy machinery if you feel dizzy.  Avoid bending down if you feel dizzy. Place items in your home so you can reach them easily without leaning over. Lifestyle  Do not use any products that contain nicotine or tobacco, such as cigarettes and e-cigarettes. If you need help quitting, ask your doctor.  Try to lower your stress level. You can do this by using methods such as yoga or  meditation. Talk with your doctor if you need help. General instructions  Watch your dizziness for any changes.  Take over-the-counter and prescription medicines only as told by your doctor. Talk with your doctor if you think that you are dizzy because of a medicine that you are taking.  Tell a friend or a family member that you are feeling dizzy. If he or she notices any changes in your behavior, have this person call your doctor.  Keep all follow-up visits as told by your doctor. This is important. Contact a doctor if:  Your dizziness does not go away.  Your dizziness or light-headedness gets worse.  You feel sick to your stomach (nauseous).  You have trouble hearing.  You have new symptoms.  You are unsteady on your feet.  You feel like the room is spinning. Get help right away if:  You throw up (vomit) or have watery poop (diarrhea), and you cannot eat or drink anything.  You have trouble: ? Talking. ? Walking. ? Swallowing. ? Using your arms, hands, or legs.  You feel generally weak.  You are not thinking clearly, or you have trouble forming sentences. A friend or family member may notice this.  You have: ? Chest pain. ? Pain in your belly (abdomen). ? Shortness of breath. ? Sweating.  Your vision changes.  You are bleeding.  You have a  very bad headache.  You have neck pain or a stiff neck.  You have a fever. These symptoms may be an emergency. Do not wait to see if the symptoms will go away. Get medical help right away. Call your local emergency services (911 in the [place] Do not drive yourself to the hospital. Summary  Dizziness makes you feel unsteady or light-headed. You may feel like you are about to pass out (faint).  Drink enough fluid to keep your pee (urine) clear or pale yellow. Do not drink alcohol.  Avoid making quick movements if you feel dizzy.  Watch your dizziness for any changes. This information is not intended to replace advice  given to you by your health care provider. Make sure you discuss any questions you have with your health care provider. Document Released: 05/08/2011 Document Revised: 06/05/2016 Document Reviewed: 06/05/2016 Elsevier Interactive Patient Education  2017 [employer].

## 2018-01-26 ENCOUNTER — Other Ambulatory Visit: Payer: Self-pay | Admitting: Family Medicine

## 2018-01-26 LAB — CBC WITH DIFFERENTIAL/PLATELET
BASOS ABS: 0 10*3/uL (ref 0.0–0.2)
BASOS: 0 %
EOS (ABSOLUTE): 0 10*3/uL (ref 0.0–0.4)
Eos: 0 %
HEMOGLOBIN: 11.1 g/dL (ref 11.1–15.9)
Hematocrit: 34 % (ref 34.0–46.6)
IMMATURE GRANS (ABS): 0 10*3/uL (ref 0.0–0.1)
IMMATURE GRANULOCYTES: 0 %
LYMPHS: 24 %
Lymphocytes Absolute: 2.2 10*3/uL (ref 0.7–3.1)
MCH: 27.4 pg (ref 26.6–33.0)
MCHC: 32.6 g/dL (ref 31.5–35.7)
MCV: 84 fL (ref 79–97)
MONOCYTES: 8 %
Monocytes Absolute: 0.7 10*3/uL (ref 0.1–0.9)
NEUTROS ABS: 6.2 10*3/uL (ref 1.4–7.0)
NEUTROS PCT: 68 %
PLATELETS: 350 10*3/uL (ref 150–450)
RBC: 4.05 x10E6/uL (ref 3.77–5.28)
RDW: 14.4 % (ref 12.3–15.4)
WBC: 9.2 10*3/uL (ref 3.4–10.8)

## 2018-01-26 LAB — COMPREHENSIVE METABOLIC PANEL
ALBUMIN: 4.2 g/dL (ref 3.5–5.5)
ALT: 7 IU/L (ref 0–32)
AST: 12 IU/L (ref 0–40)
Albumin/Globulin Ratio: 1.4 (ref 1.2–2.2)
Alkaline Phosphatase: 101 IU/L (ref 39–117)
BUN / CREAT RATIO: 14 (ref 9–23)
BUN: 10 mg/dL (ref 6–20)
Bilirubin Total: 0.3 mg/dL (ref 0.0–1.2)
CALCIUM: 9.1 mg/dL (ref 8.7–10.2)
CO2: 21 mmol/L (ref 20–29)
Chloride: 108 mmol/L — ABNORMAL HIGH (ref 96–106)
Creatinine, Ser: 0.72 mg/dL (ref 0.57–1.00)
GFR, EST AFRICAN AMERICAN: 123 mL/min/{1.73_m2} (ref >59)
GFR, EST NON AFRICAN AMERICAN: 107 mL/min/{1.73_m2} (ref >59)
GLUCOSE: 84 mg/dL (ref 65–99)
Globulin, Total: 3.1 g/dL (ref 1.5–4.5)
Potassium: 4.1 mmol/L (ref 3.5–5.2)
Sodium: 143 mmol/L (ref 134–144)
TOTAL PROTEIN: 7.3 g/dL (ref 6.0–8.5)

## 2018-01-26 LAB — IRON,TIBC AND FERRITIN PANEL
Ferritin: 15 ng/mL (ref 15–150)
IRON SATURATION: 20 % (ref 15–55)
IRON: 67 ug/dL (ref 27–159)
TIBC: 343 ug/dL (ref 250–450)
UIBC: 276 ug/dL (ref 131–425)

## 2018-01-26 LAB — VITAMIN B12: Vitamin B-12: 295 pg/mL (ref 232–1245)

## 2018-01-26 LAB — VITAMIN D 25 HYDROXY (VIT D DEFICIENCY, FRACTURES): Vit D, 25-Hydroxy: 18.6 ng/mL — ABNORMAL LOW (ref 30.0–100.0)

## 2018-01-26 LAB — T4, FREE: FREE T4: 1.04 ng/dL (ref 0.82–1.77)

## 2018-01-26 LAB — TSH: TSH: 1.87 u[IU]/mL (ref 0.450–4.500)

## 2018-01-26 MED ORDER — VITAMIN D (ERGOCALCIFEROL) 1.25 MG (50000 UNIT) PO CAPS: 50000.0000 [IU] | ORAL_CAPSULE | ORAL | 0 refills | Status: DC

## 2018-01-28 ENCOUNTER — Encounter: Payer: Self-pay | Admitting: Family Medicine

## 2018-01-28 ENCOUNTER — Ambulatory Visit: Payer: BC Managed Care – PPO | Admitting: Family Medicine

## 2018-01-28 VITALS — BP 120/80 | HR 77 | Temp 98.9°F

## 2018-01-28 DIAGNOSIS — R42 Dizziness and giddiness: Secondary | ICD-10-CM

## 2018-01-28 DIAGNOSIS — R7303 Prediabetes: Secondary | ICD-10-CM | POA: Diagnosis not present

## 2018-01-28 DIAGNOSIS — Z6379 Other stressful life events affecting family and household: Secondary | ICD-10-CM

## 2018-01-28 DIAGNOSIS — E559 Vitamin D deficiency, unspecified: Secondary | ICD-10-CM

## 2018-01-28 DIAGNOSIS — Z09 Encounter for follow-up examination after completed treatment for conditions other than malignant neoplasm: Secondary | ICD-10-CM

## 2018-01-28 DIAGNOSIS — F411 Generalized anxiety disorder: Secondary | ICD-10-CM

## 2018-01-28 MED ORDER — ALPRAZOLAM 0.25 MG PO TABS: 0.2500 mg | ORAL_TABLET | Freq: Two times a day (BID) | ORAL | 0 refills | Status: DC | PRN

## 2018-01-28 MED ORDER — CITALOPRAM HYDROBROMIDE 20 MG PO TABS: 20.0000 mg | ORAL_TABLET | Freq: Every day | ORAL | 1 refills | Status: DC

## 2018-01-28 NOTE — Progress Notes (Signed)
   Subjective:    Patient ID: Karla [name], female    DOB: [birth year], 37 y.o.   MRN: [id number]  HPI Chief Complaint  Patient presents with  . follow-up    follow-up. doing better.    She is here to follow up on positional dizziness, blurred vision and headache. States dizziness has improved. She has taken 2 doses of the meclizine.  Blurred vision resolved.  She continues to have a mild frontal headache and is taking Tylenol for this. States she is having increased anxiety due to worsening health of her maternal grandfather. He is now is Hospice care and her father passed away last year in Hospice care. States she has a history of anxiety in crowds and other situations. Has been in counseling. States her counselor told her in the past that she would benefit from a long term medication for anxiety. She has taken alprazolam in the past which I prescribed for her for a short time.  Denies SI or HI.   Reviewed labs with her and she is aware that she has prediabetes, borderline low vitamin B12 and vitamin D deficiency.   Denies fever, chills, chest pain, palpitations, shortness of breath, abdominal pain, N/V/D, urinary symptoms.   Reviewed allergies, medications, past medical, surgical, family, and social history.     Review of Systems Pertinent positives and negatives in the history of present illness.     Objective:   Physical Exam BP 120/80   Pulse 77   Temp 98.9 F (37.2 C) (Oral)   Alert and in no distress. Pharyngeal area is normal. Neck is supple without adenopathy or thyromegaly. Cardiac exam shows a regular sinus rhythm without murmurs or gallops. Lungs are clear to auscultation. Extremities without edema. Skin is warm and dry, no pallor.       Assessment & Plan:  Dizziness  Follow up  Vitamin D deficiency  Prediabetes  GAD (generalized anxiety disorder) - Plan: citalopram (CELEXA) 20 MG tablet, ALPRAZolam (XANAX) 0.25 MG tablet  Sickness in family  Dizziness  has improved with meclizine.  Appears to be BPPV.  Continue taking 1/2-1 whole tablet as needed. Vision has cleared.  She will see her eye doctor as scheduled. Prediabetes-hemoglobin A1c 5.8%.  Discussed healthy diet by cutting back on sweets and carbohydrates and increasing physical activity Vitamin D deficiency-take once weekly prescription strength vitamin D for the next several weeks and then return for a recheck of her vitamin D level. Vitamin B12 was borderline low and she will start taking over-the-counter supplement for this. GAD-she will start seeing hospice counselor in regards to her grandfathers worsening health and we will start her on citalopram per patient request.  Discussed starting on half tablet for the first week.  I will also prescribe alprazolam for her to use as needed for severe anxiety.  She is aware that this is a short-term medication and that I will not refill it.  She will avoid alcohol and driving while taking it.  No's thoughts of self-harm She will follow-up in 2 weeks.

## 2018-01-28 NOTE — Patient Instructions (Addendum)
Continue the meclizine as needed. You can take 1/2 to 1 whole tablet.  Take the vitamin D prescription ONCE WEEKLY and over the counter vitamin B12.   See your eye doctor.    Take 1/2 tablet (10 mg) of the citalopram for the first week every evening. As long as you are doing well, increase to a whole tablet (20 mg) once daily week 2.  Take the alprazolam as needed for worsening anxiety. Do not drink alcohol or drive when you take this.  Schedule with your counselor.  Follow up with me in 2 weeks.

## 2018-02-11 ENCOUNTER — Ambulatory Visit: Payer: BC Managed Care – PPO | Admitting: Family Medicine

## 2018-12-06 IMAGING — CR DG LUMBAR SPINE COMPLETE 4+V
5 series · 5 of 5 positions shown · non-contrast
Comparison: None.

CLINICAL DATA: Acute right-sided low back pain.

EXAM:
LUMBAR SPINE - COMPLETE 4+ VIEW

[w lumbar spine ap]
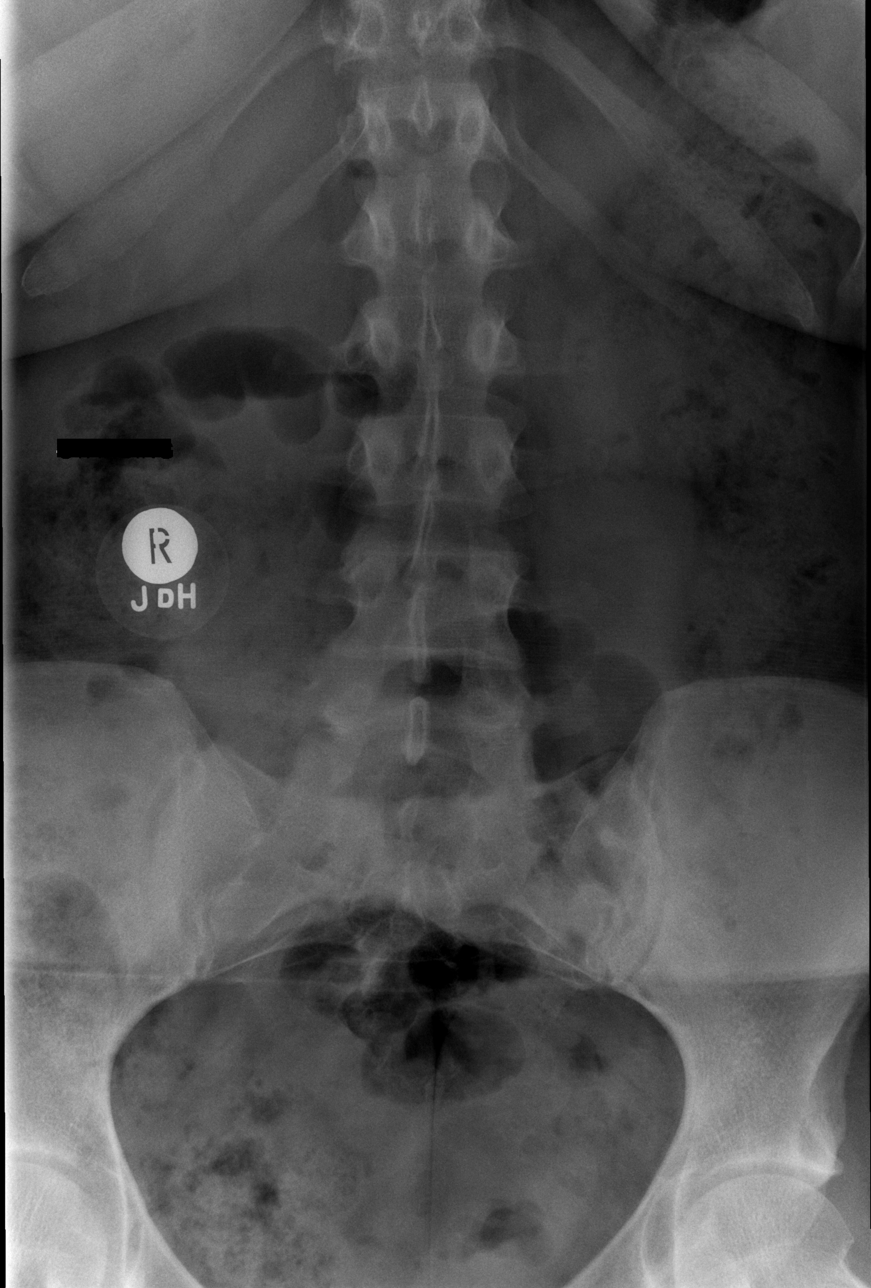

[w lumbar spine obl (1 of 2)]
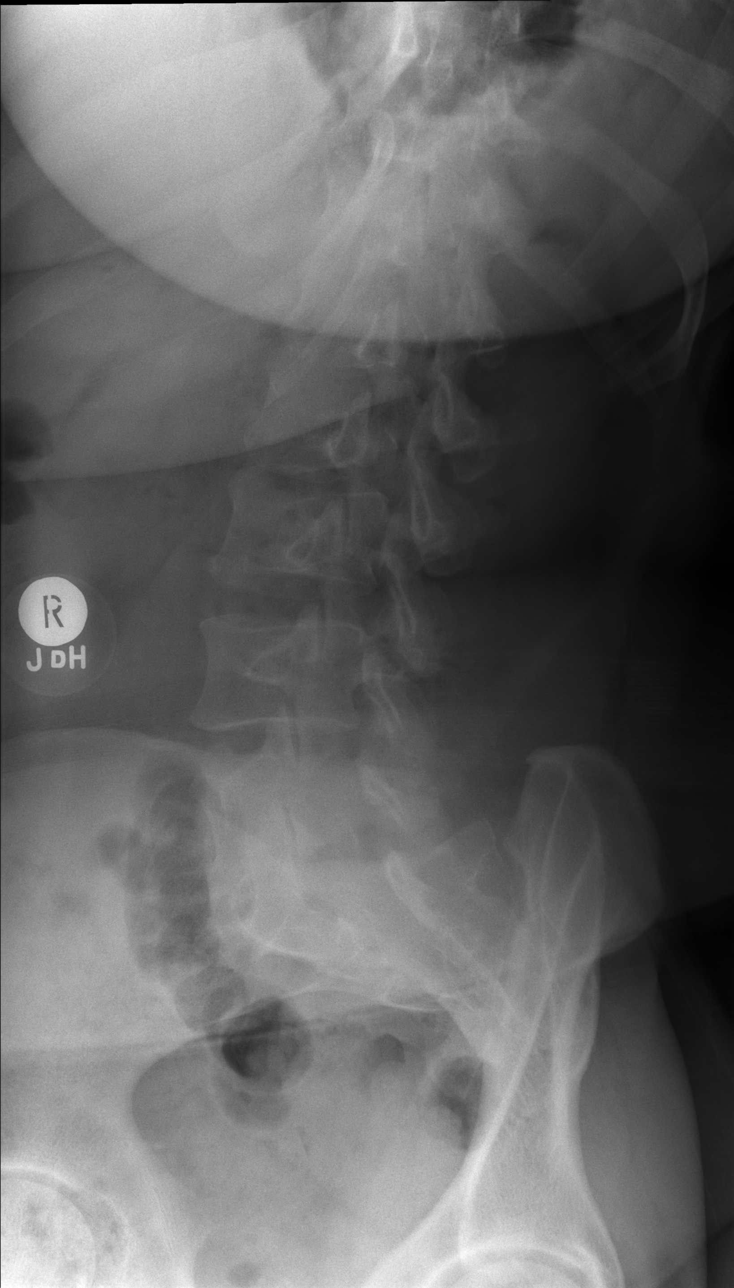

[w lumbar spine obl (2 of 2)]
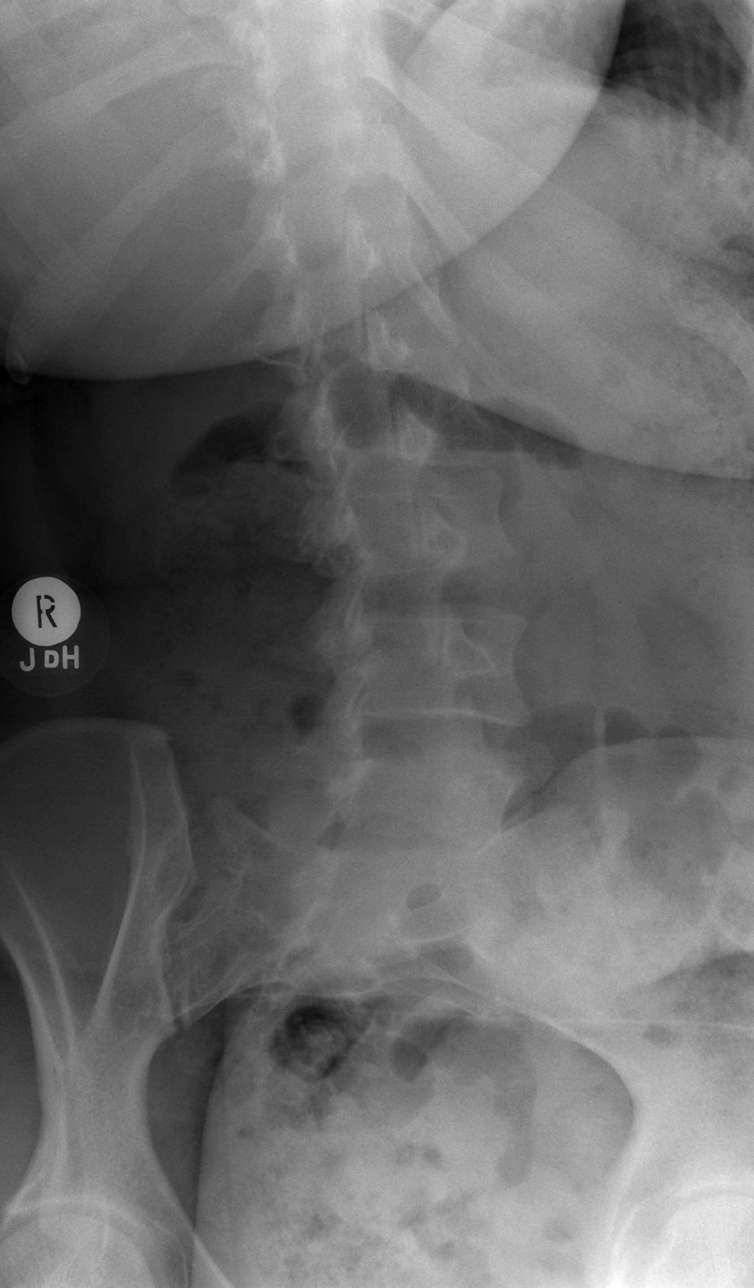

[w lumbar spine lat]
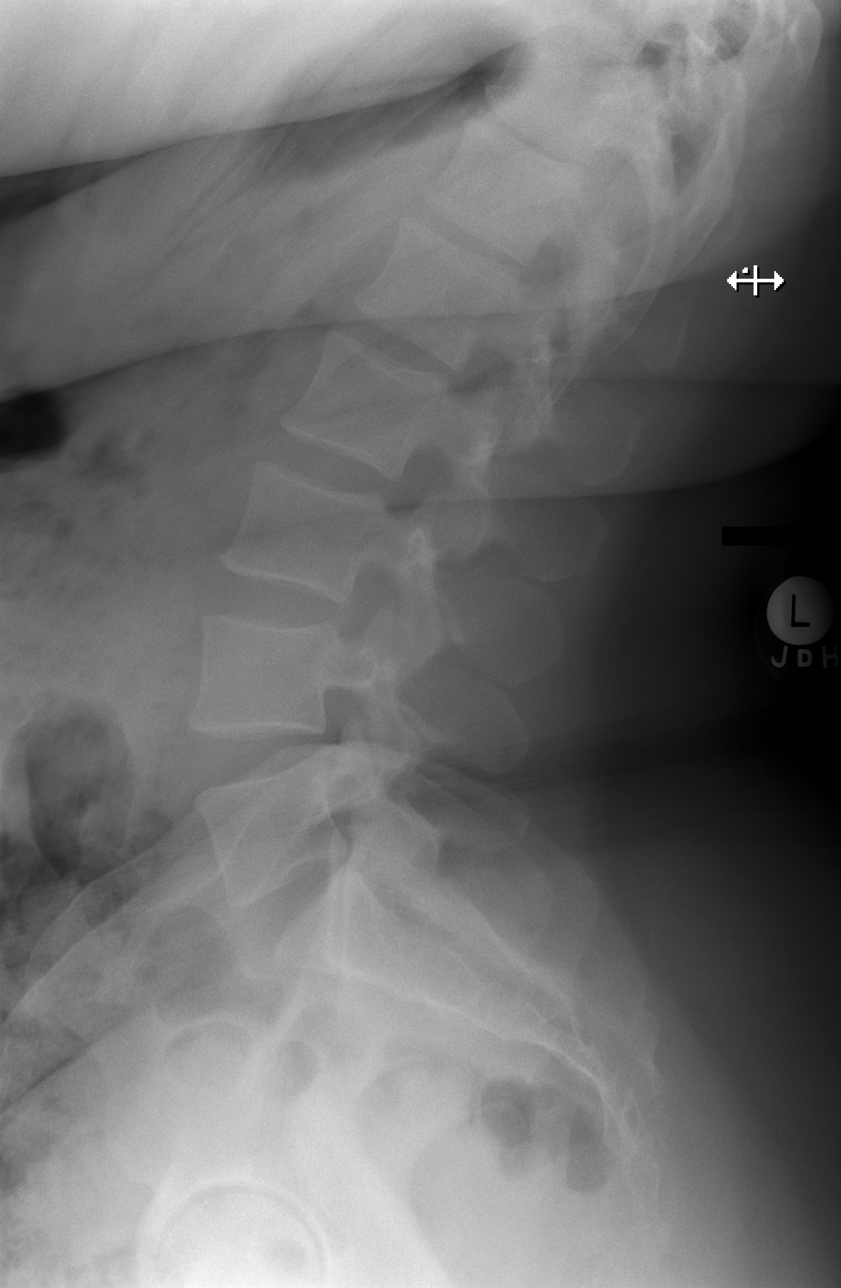

[w lumbar l-5 s-1 spot]
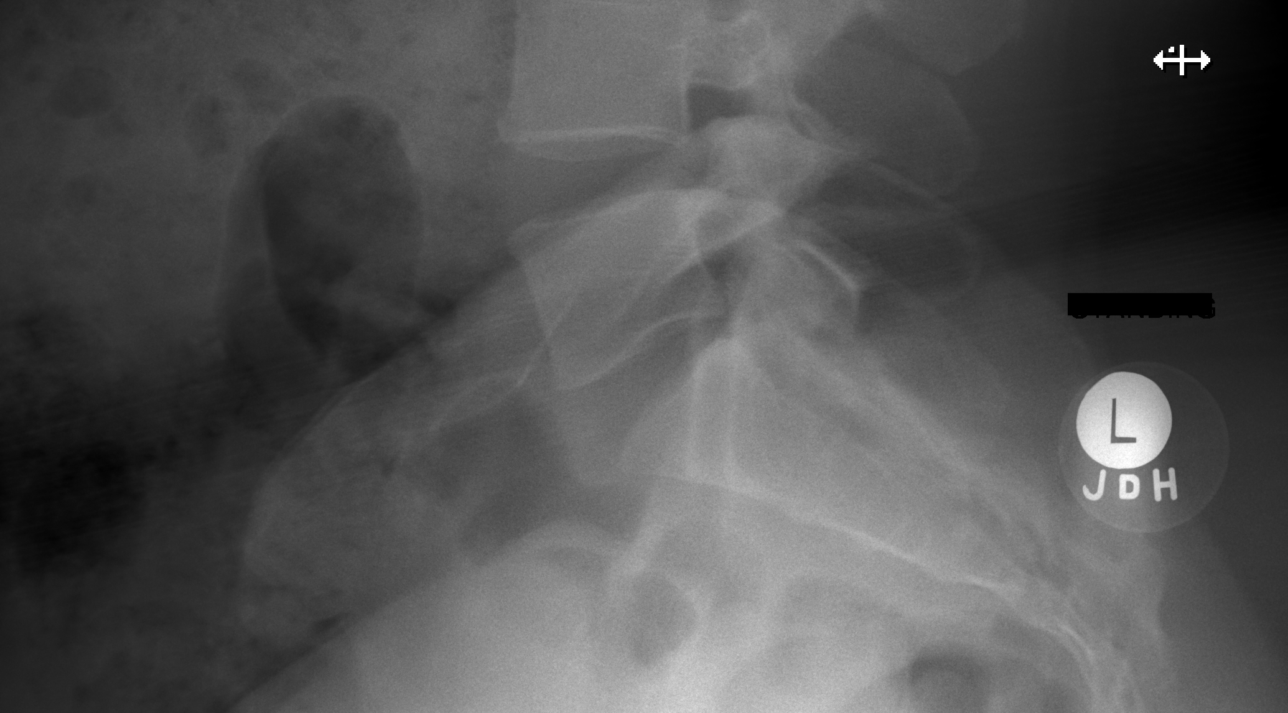

[5 of 5 positions shown; findings below may reference images not displayed]

FINDINGS: There is no evidence of lumbar spine fracture. Alignment is normal.
Intervertebral disc spaces are maintained.
IMPRESSION: Normal lumbar spine.

## 2019-04-12 LAB — HM PAP SMEAR: HM Pap smear: ABNORMAL

## 2019-08-11 NOTE — Progress Notes (Signed)
   Subjective:    Patient ID: Karla [name], female    DOB: May 09, 1981, 39 y.o.   MRN: [id number]  HPI Chief Complaint  Patient presents with  . med check    med check    She has not been here since 12/2017.   States her anxiety has gotten much worse since having to go back to work.  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 At her last visit in August 2019 I started her on citalopram which she states she only took for 1 month and then stopped. She recalls it did help and no side effects.  She would like to start back on it today.  Had a therapist but stopped during pandemic.  She is in the process of finding a new therapist. She denies any suicidal ideations.  She denies self-medicating with alcohol or drugs.  States she rarely drinks.  She has been eating more. History of prediabetes and would like to return for a fasting CPE and have blood work done at that point.  Vitamin D def- 1,000 IUs 2-3 days per week.    LMP: 08/02/2019  Is not sexually active.   Depression screen [facility] 2/9 08/12/2019 01/28/2018 01/16/2017  Decreased Interest 1 0 3  Down, Depressed, Hopeless 1 0 3  PHQ - 2 Score 2 0 6  Altered sleeping 0 - 3  Tired, decreased energy 2 - 3  Change in appetite 3 - 3  Feeling bad or failure about yourself  0 - 0  Trouble concentrating 3 - 0  Moving slowly or fidgety/restless 1 - 0  Suicidal thoughts - - 1  PHQ-9 Score 11 - 16  Difficult doing work/chores Somewhat difficult - Somewhat difficult      Review of Systems Pertinent positives and negatives in the history of present illness.     Objective:   Physical Exam BP 130/90   Pulse (!) 103   Temp 98.7 F (37.1 C)   Wt 262 lb 9.6 oz (119.1 kg)   BMI 41.75 kg/m   Alert and oriented in no acute distress. Not otherwise examined.        Assessment & Plan:  GAD (generalized anxiety disorder) - Plan: citalopram (CELEXA) 20 MG  tablet  Prediabetes  Vitamin D deficiency  Here to discuss worsening anxiety.  States it is related to having to go back to in person working in school. She did fine with Celexa in 2019 and would like to start back on this medication.  Prescription sent to pharmacy.  She will start with 1/2 tablet for the first week and if she is doing well she will increase to the full tablet week 2.  Discussed potential side effects.  She will follow-up virtually in 2 weeks or sooner if needed.  She has been involved in therapy in the past but currently does not have a therapist.  She does plan to find a new one.  She declines blood work today.  States she will schedule for a CPE in the next few weeks.

## 2019-08-12 ENCOUNTER — Other Ambulatory Visit: Payer: Self-pay

## 2019-08-12 ENCOUNTER — Encounter: Payer: Self-pay | Admitting: Family Medicine

## 2019-08-12 ENCOUNTER — Ambulatory Visit (INDEPENDENT_AMBULATORY_CARE_PROVIDER_SITE_OTHER): Payer: BC Managed Care – PPO | Admitting: Family Medicine

## 2019-08-12 VITALS — BP 130/90 | HR 103 | Temp 98.7°F | Wt 262.6 lb

## 2019-08-12 DIAGNOSIS — R7303 Prediabetes: Secondary | ICD-10-CM

## 2019-08-12 DIAGNOSIS — F411 Generalized anxiety disorder: Secondary | ICD-10-CM

## 2019-08-12 DIAGNOSIS — E559 Vitamin D deficiency, unspecified: Secondary | ICD-10-CM

## 2019-08-12 MED ORDER — CITALOPRAM HYDROBROMIDE 20 MG PO TABS: 20.0000 mg | ORAL_TABLET | Freq: Every day | ORAL | 1 refills | Status: DC

## 2019-08-12 NOTE — Patient Instructions (Signed)
Start taking 1/2 tablet daily for the first week. If you are doing fine, increase to the whole tablet week 2.   Follow up virtually with me in 2 weeks.

## 2019-08-26 ENCOUNTER — Other Ambulatory Visit: Payer: Self-pay

## 2019-08-26 ENCOUNTER — Encounter: Payer: Self-pay | Admitting: Family Medicine

## 2019-08-26 ENCOUNTER — Ambulatory Visit: Payer: BC Managed Care – PPO | Admitting: Family Medicine

## 2019-08-26 VITALS — Wt 260.0 lb

## 2019-08-26 DIAGNOSIS — F411 Generalized anxiety disorder: Secondary | ICD-10-CM | POA: Diagnosis not present

## 2019-08-26 NOTE — Progress Notes (Signed)
   Subjective:  Documentation for virtual audio and video telecommunications through [employer] encounter:  The patient was located at work. 2 patient identifiers used.  The provider was located in the office. The patient did consent to this visit and is aware of possible charges through their insurance for this visit.  The other persons participating in this telemedicine service were none.    Patient ID: Karla [name], female    DOB: 12/29/1980, 39 y.o.   MRN: [id number]  HPI Chief Complaint  Patient presents with  . follow-up    follow-up- 2 week follow-up doesn't feel any different   This is a 2 week follow up on anxiety and starting on Celexa. She is not any worse and no side effects. States last week was less stressful in general and she was able to spend time with family. She wants to continue on the medication. No new concerns.  Has not schedule with a therapist.    Review of Systems Pertinent positives and negatives in the history of present illness.     Objective:   Physical Exam Wt 260 lb (117.9 kg)   BMI 41.34 kg/m    Alert and oriented and in no acute distress. Normal speech, mood.      Assessment & Plan:  GAD (generalized anxiety disorder)  She will continue on Celexa and follow up in 2 weeks if she does not notice any improvement or if she is having worsening symptoms. Consider increasing dose if needed. Recommended counseling again.   Time spent on call was 10 minutes and in review of previous records 10 minutes total.  This virtual service is not related to other E/M service within previous 7 days.

## 2019-09-25 NOTE — Patient Instructions (Addendum)
Call and schedule your mammogram with the [facility] will hear from [facility] but this could take a few weeks.   I will be in touch with your lab results.    Preventive Care 40-40 Years Old, Female Preventive care refers to visits with your health care provider and lifestyle choices that can promote health and wellness. This includes:  A yearly physical exam. This may also be called an annual well check.  Regular dental visits and eye exams.  Immunizations.  Screening for certain conditions.  Healthy lifestyle choices, such as eating a healthy diet, getting regular exercise, not using drugs or products that contain nicotine and tobacco, and limiting alcohol use. What can I expect for my preventive care visit? Physical exam Your health care provider will check your:  Height and weight. This may be used to calculate body mass index (BMI), which tells if you are at a healthy weight.  Heart rate and blood pressure.  Skin for abnormal spots. Counseling Your health care provider may ask you questions about your:  Alcohol, tobacco, and drug use.  Emotional well-being.  Home and relationship well-being.  Sexual activity.  Eating habits.  Work and work Statistician.  Method of birth control.  Menstrual cycle.  Pregnancy history. What immunizations do I need?  Influenza (flu) vaccine  This is recommended every year. Tetanus, diphtheria, and pertussis (Tdap) vaccine  You may need a Td booster every 10 years. Varicella (chickenpox) vaccine  You may need this if you have not been vaccinated. Zoster (shingles) vaccine  You may need this after age 40. Measles, mumps, and rubella (MMR) vaccine  You may need at least one dose of MMR if you were born in 1957 or later. You may also need a second dose. Pneumococcal conjugate (PCV13) vaccine  You may need this if you have certain conditions and were not previously vaccinated. Pneumococcal  polysaccharide (PPSV23) vaccine  You may need one or two doses if you smoke cigarettes or if you have certain conditions. Meningococcal conjugate (MenACWY) vaccine  You may need this if you have certain conditions. Hepatitis A vaccine  You may need this if you have certain conditions or if you travel or work in places where you may be exposed to hepatitis A. Hepatitis B vaccine  You may need this if you have certain conditions or if you travel or work in places where you may be exposed to hepatitis B. Haemophilus influenzae type b (Hib) vaccine  You may need this if you have certain conditions. Human papillomavirus (HPV) vaccine  If recommended by your health care provider, you may need three doses over 6 months. You may receive vaccines as individual doses or as more than one vaccine together in one shot (combination vaccines). Talk with your health care provider about the risks and benefits of combination vaccines. What tests do I need? Blood tests  Lipid and cholesterol levels. These may be checked every 5 years, or more frequently if you are over 40 years old.  Hepatitis C test.  Hepatitis B test. Screening  Lung cancer screening. You may have this screening every year starting at age 40 if you have a 30-pack-year history of smoking and currently smoke or have quit within the past 15 years.  Colorectal cancer screening. All adults should have this screening starting at age 40 and continuing until age 20. Your health care provider may recommend screening at age 40 if you are at increased risk. You will  have tests every 1-10 years, depending on your results and the type of screening test.  Diabetes screening. This is done by checking your blood sugar (glucose) after you have not eaten for a while (fasting). You may have this done every 1-3 years.  Mammogram. This may be done every 1-2 years. Talk with your health care provider about when you should start having regular  mammograms. This may depend on whether you have a family history of breast cancer.  BRCA-related cancer screening. This may be done if you have a family history of breast, ovarian, tubal, or peritoneal cancers.  Pelvic exam and Pap test. This may be done every 3 years starting at age 40. Starting at age 24, this may be done every 5 years if you have a Pap test in combination with an HPV test. Other tests  Sexually transmitted disease (STD) testing.  Bone density scan. This is done to screen for osteoporosis. You may have this scan if you are at high risk for osteoporosis. Follow these instructions at home: Eating and drinking  Eat a diet that includes fresh fruits and vegetables, whole grains, lean protein, and low-fat dairy.  Take vitamin and mineral supplements as recommended by your health care provider.  Do not drink alcohol if: ? Your health care provider tells you not to drink. ? You are pregnant, may be pregnant, or are planning to become pregnant.  If you drink alcohol: ? Limit how much you have to 0-1 drink a day. ? Be aware of how much alcohol is in your drink. In the [place] one drink equals one 12 oz bottle of beer (355 mL), one 5 oz glass of wine (148 mL), or one 1 oz glass of hard liquor (44 mL). Lifestyle  Take daily care of your teeth and gums.  Stay active. Exercise for at least 30 minutes on 5 or more days each week.  Do not use any products that contain nicotine or tobacco, such as cigarettes, e-cigarettes, and chewing tobacco. If you need help quitting, ask your health care provider.  If you are sexually active, practice safe sex. Use a condom or other form of birth control (contraception) in order to prevent pregnancy and STIs (sexually transmitted infections).  If told by your health care provider, take low-dose aspirin daily starting at age 74. What's next?  Visit your health care provider once a year for a well check visit.  Ask your health care provider  how often you should have your eyes and teeth checked.  Stay up to date on all vaccines. This information is not intended to replace advice given to you by your health care provider. Make sure you discuss any questions you have with your health care provider. Document Revised: 01/28/2018 Document Reviewed: 01/28/2018 Elsevier Patient Education  2020 [employer].

## 2019-09-25 NOTE — Progress Notes (Signed)
Subjective:    Patient ID: Karla [name], female    DOB: Dec 16, 1980, 39 y.o.   MRN: [id number]  HPI Chief Complaint  Patient presents with  . cpe    not fasitng cpe, sees obgyn   She is here for a complete physical exam. Last CPE: 2016  Other providers: OB/GYN- [facility] Dr. Earlean Shawl   Prediabetes- A1c 5.8% in 2019  Vitamin D def- taking an OTC supplement.   GAD- taking Celexa and doing fine.   Family history of kidney cancer   States occasionally she has a deep cough with laughing. Thinks she may have allergies but is not taking anything currently. Will let me know if worsening.   Complains of difficulty losing weight and actually is gaining still.  Thinks she has a slow metabolism.   States she tried [employer] for 4 months and lost 10 lbs. Could not continue it, affordability issue.  Tried [employer].  States she is using My Fitness Pal. Eating 1,200-1,500 calories daily    Social history: Lives with her 2 children and mother at times, works for [employer]. Sedentary job.  Denies smoking, drinking alcohol, drug use  Diet: reports a healthy diet Excerise: walking for exercise. 2 miles daily   Immunizations: Covid vaccine J and J during Easter weekend. States she still has a "knot" where she received the injection.   Health maintenance:  Mammogram: never. Just turned 39 Colonoscopy: never  Last Gynecological Exam: up to date Last Dental Exam: October 2020 Last Eye Exam: last year   Depression screen [facility] 2/9 09/26/2019 08/26/2019 08/12/2019 01/28/2018 01/16/2017  Decreased Interest 0 0 1 0 3  Down, Depressed, Hopeless 0 0 1 0 3  PHQ - 2 Score 0 0 2 0 6  Altered sleeping - - 0 - 3  Tired, decreased energy - - 2 - 3  Change in appetite - - 3 - 3  Feeling bad or failure about yourself  - - 0 - 0  Trouble concentrating - - 3 - 0  Moving slowly or fidgety/restless - - 1 - 0  Suicidal thoughts - - - - 1  PHQ-9 Score - - 11 - 16  Difficult doing work/chores - -  Somewhat difficult - Somewhat difficult     Wears seatbelt always, smoke detectors in home and functioning, does not text while driving and feels safe in home environment.   Reviewed allergies, medications, past medical, surgical, family, and social history.   Review of Systems Review of Systems Constitutional: -fever, -chills, -sweats, +unexpected weight change,-fatigue ENT: -runny nose, -ear pain, -sore throat Cardiology:  -chest pain, -palpitations, -edema Respiratory: + intermittent (with laughing)cough, -shortness of breath, -wheezing Gastroenterology: -abdominal pain, -nausea, -vomiting, -diarrhea, -constipation  Hematology: -bleeding or bruising problems Musculoskeletal: -arthralgias, -myalgias, -joint swelling, -back pain Ophthalmology: -vision changes Urology: -dysuria, -difficulty urinating, -hematuria, -urinary frequency, -urgency Neurology: -headache, -weakness, -tingling, -numbness       Objective:   Physical Exam BP 120/70   Pulse 75   Temp (!) 96.9 F (36.1 C)   Ht 5' 7.25" (1.708 m)   Wt 265 lb 6.4 oz (120.4 kg)   BMI 41.26 kg/m   General Appearance:    Alert, cooperative, no distress, appears stated age  Head:    Normocephalic, without obvious abnormality, atraumatic  Eyes:    PERRL, conjunctiva/corneas clear, EOM's intact  Ears:    Normal TM's and external ear canals  Nose:   Nares normal, mucosa normal, no drainage or sinus  tenderness  Throat:   Lips, mucosa, and tongue normal; teeth and gums normal  Neck:   Supple, no lymphadenopathy;  thyroid:  no   enlargement/tenderness/nodules; no JVD  Back:    Spine nontender, no curvature, ROM normal, no CVA     tenderness  Lungs:     Clear to auscultation bilaterally without wheezes, rales or     ronchi; respirations unlabored  Chest Wall:    No tenderness or deformity   Heart:    Regular rate and rhythm, S1 and S2 normal, no murmur, rub   or gallop  Breast Exam:    OB/GYN   Abdomen:     Soft, non-tender,  nondistended, normoactive bowel sounds,    no masses, no hepatosplenomegaly  Genitalia:    OB/GYN      Extremities:   No clubbing, cyanosis or edema  Pulses:   2+ and symmetric all extremities  Skin:   Skin color, texture, turgor normal, no rashes or lesions  Lymph nodes:   Cervical, supraclavicular, and axillary nodes normal  Neurologic:   CNII-XII intact, normal strength, sensation and gait           Psych:   Normal mood, affect, hygiene and grooming.         Assessment & Plan:  Routine general medical examination at a health care facility - Plan: CBC with Differential/Platelet, Comprehensive metabolic panel, TSH, T4, free, T3, Lipid panel -Here today for fasting CPE.  Preventive health care reviewed.  She sees her OB/GYN but has not had her first mammogram yet.  She just recently turned 36.  Would like to get this done.  She will call and schedule at the breast center.  I will request Pap smear results from her OB/GYN office.  No indication that she needs a Pap smear earlier than 45.  Counseled on healthy lifestyle including diet and exercise.  Immunizations reviewed.  Prediabetes - Plan: Hemoglobin A1c -Counseling on healthy lifestyle including diet and exercise.  Follow-up pending A1c result  Vitamin D deficiency - Plan: VITAMIN D 25 Hydroxy (Vit-D Deficiency, Fractures) She is not sure the dose of vitamin D that she is currently taking.  Check vitamin D level and adjust dose as appropriate.  GAD (generalized anxiety disorder) -She has been on citalopram for approximately 1 month.  States she is doing well.  No side effects.  Encounter for screening mammogram for malignant neoplasm of breast - Plan: MM DIGITAL SCREENING BILATERAL -She will call and schedule her mammogram at the breast center.  This is her first mammogram  Morbid obesity ([facility]) - Plan: TSH, T4, free, T3, Lipid panel, Amb Ref to Medical Weight Management -She appears frustrated about difficulty with weight loss and  actually has gained weight.  She has tried several diets including weight watchers and up to be a.  Currently using my fitness pal.  Reports regular exercise.  She would like a referral to [facility] health weight management which I ordered.  Check labs and follow-up.

## 2019-09-26 ENCOUNTER — Ambulatory Visit (INDEPENDENT_AMBULATORY_CARE_PROVIDER_SITE_OTHER): Payer: BC Managed Care – PPO | Admitting: Family Medicine

## 2019-09-26 ENCOUNTER — Other Ambulatory Visit: Payer: Self-pay

## 2019-09-26 ENCOUNTER — Encounter: Payer: Self-pay | Admitting: Family Medicine

## 2019-09-26 VITALS — BP 120/70 | HR 75 | Temp 96.9°F | Ht 67.25 in | Wt 265.4 lb

## 2019-09-26 DIAGNOSIS — F411 Generalized anxiety disorder: Secondary | ICD-10-CM

## 2019-09-26 DIAGNOSIS — E559 Vitamin D deficiency, unspecified: Secondary | ICD-10-CM | POA: Diagnosis not present

## 2019-09-26 DIAGNOSIS — Z1231 Encounter for screening mammogram for malignant neoplasm of breast: Secondary | ICD-10-CM

## 2019-09-26 DIAGNOSIS — R7303 Prediabetes: Secondary | ICD-10-CM | POA: Diagnosis not present

## 2019-09-26 DIAGNOSIS — Z0000 Encounter for general adult medical examination without abnormal findings: Secondary | ICD-10-CM | POA: Diagnosis not present

## 2019-09-27 LAB — COMPREHENSIVE METABOLIC PANEL
ALT: 7 IU/L (ref 0–32)
AST: 13 IU/L (ref 0–40)
Albumin/Globulin Ratio: 1.3 (ref 1.2–2.2)
Albumin: 4.1 g/dL (ref 3.8–4.8)
Alkaline Phosphatase: 113 IU/L (ref 39–117)
BUN/Creatinine Ratio: 10 (ref 9–23)
BUN: 8 mg/dL (ref 6–24)
Bilirubin Total: 0.3 mg/dL (ref 0.0–1.2)
CO2: 21 mmol/L (ref 20–29)
Calcium: 9 mg/dL (ref 8.7–10.2)
Chloride: 105 mmol/L (ref 96–106)
Creatinine, Ser: 0.81 mg/dL (ref 0.57–1.00)
GFR calc Af Amer: 105 mL/min/{1.73_m2} (ref >59)
GFR calc non Af Amer: 91 mL/min/{1.73_m2} (ref >59)
Globulin, Total: 3.1 g/dL (ref 1.5–4.5)
Glucose: 102 mg/dL — ABNORMAL HIGH (ref 65–99)
Potassium: 4.3 mmol/L (ref 3.5–5.2)
Sodium: 139 mmol/L (ref 134–144)
Total Protein: 7.2 g/dL (ref 6.0–8.5)

## 2019-09-27 LAB — CBC WITH DIFFERENTIAL/PLATELET
Basophils Absolute: 0 10*3/uL (ref 0.0–0.2)
Basos: 0 %
EOS (ABSOLUTE): 0 10*3/uL (ref 0.0–0.4)
Eos: 0 %
Hematocrit: 36.4 % (ref 34.0–46.6)
Hemoglobin: 11.5 g/dL (ref 11.1–15.9)
Immature Grans (Abs): 0 10*3/uL (ref 0.0–0.1)
Immature Granulocytes: 0 %
Lymphocytes Absolute: 2.3 10*3/uL (ref 0.7–3.1)
Lymphs: 29 %
MCH: 27.6 pg (ref 26.6–33.0)
MCHC: 31.6 g/dL (ref 31.5–35.7)
MCV: 87 fL (ref 79–97)
Monocytes Absolute: 0.6 10*3/uL (ref 0.1–0.9)
Monocytes: 8 %
Neutrophils Absolute: 4.9 10*3/uL (ref 1.4–7.0)
Neutrophils: 63 %
Platelets: 376 10*3/uL (ref 150–450)
RBC: 4.17 x10E6/uL (ref 3.77–5.28)
RDW: 14.2 % (ref 11.7–15.4)
WBC: 7.9 10*3/uL (ref 3.4–10.8)

## 2019-09-27 LAB — T3: T3, Total: 119 ng/dL (ref 71–180)

## 2019-09-27 LAB — T4, FREE: Free T4: 0.93 ng/dL (ref 0.82–1.77)

## 2019-09-27 LAB — LIPID PANEL
Chol/HDL Ratio: 4.1 ratio (ref 0.0–4.4)
Cholesterol, Total: 144 mg/dL (ref 100–199)
HDL: 35 mg/dL — ABNORMAL LOW (ref >39)
LDL Chol Calc (NIH): 87 mg/dL (ref 0–99)
Triglycerides: 119 mg/dL (ref 0–149)
VLDL Cholesterol Cal: 22 mg/dL (ref 5–40)

## 2019-09-27 LAB — HEMOGLOBIN A1C
Est. average glucose Bld gHb Est-mCnc: 117 mg/dL
Hgb A1c MFr Bld: 5.7 % — ABNORMAL HIGH (ref 4.8–5.6)

## 2019-09-27 LAB — VITAMIN D 25 HYDROXY (VIT D DEFICIENCY, FRACTURES): Vit D, 25-Hydroxy: 26.5 ng/mL — ABNORMAL LOW (ref 30.0–100.0)

## 2019-09-27 LAB — TSH: TSH: 1.63 u[IU]/mL (ref 0.450–4.500)

## 2019-09-27 NOTE — Progress Notes (Signed)
Her vitamin D is still a little low. I recommend that she take a daily vitamin D supplement. Please find out how much she is currently taking and have her increase this by 1,000 IUs daily. She is still in the prediabetes range. Otherwise, her labs are fine. I referred her to [facility] weight management but let her know it could take a few weeks before she hears from them.

## 2019-10-06 ENCOUNTER — Telehealth: Payer: Self-pay | Admitting: Family Medicine

## 2019-10-06 NOTE — Telephone Encounter (Signed)
Received requested records from [facility] 

## 2019-10-11 ENCOUNTER — Encounter: Payer: Self-pay | Admitting: Internal Medicine

## 2019-11-04 ENCOUNTER — Ambulatory Visit: Payer: BC Managed Care – PPO

## 2019-12-02 ENCOUNTER — Telehealth: Payer: BC Managed Care – PPO | Admitting: Family Medicine

## 2019-12-02 ENCOUNTER — Encounter: Payer: Self-pay | Admitting: Family Medicine

## 2019-12-02 VITALS — Wt 260.0 lb

## 2019-12-02 DIAGNOSIS — N3000 Acute cystitis without hematuria: Secondary | ICD-10-CM | POA: Diagnosis not present

## 2019-12-02 MED ORDER — NITROFURANTOIN MONOHYD MACRO 100 MG PO CAPS: 100.0000 mg | ORAL_CAPSULE | Freq: Two times a day (BID) | ORAL | 0 refills | Status: DC

## 2019-12-02 NOTE — Progress Notes (Signed)
   Subjective:  Documentation for virtual audio and video telecommunications through [employer] encounter:  The patient was located at home. 2 patient identifiers used.  The provider was located in the office. The patient did consent to this visit and is aware of possible charges through their insurance for this visit.  The other persons participating in this telemedicine service were none. Time spent on call was 12 minutes and in review of previous records 15 minutes total.  This virtual service is not related to other E/M service within previous 7 days.   Patient ID: Karla [name], female    DOB: [birth year], 39 y.o.   MRN: [id number]  HPI Chief Complaint  Patient presents with  . Possible UTI    possible UTI- burning with urination, frequency started yesterday. taking azo and some relief    Complains of a 2 day history of dysuria, urinary frequency and urgency. States this feels like her usual UTIs. States she is at [employer] and cannot come in for a visit.  Last UTI: over a year ago. No hx of pyelonephritis or complicated UTI No chance of pregnancy.  She has had an ablation.  Denies fever, chills, abdominal pain, back pain, N/V/D, or vaginal discharge. No concern for STD  Reviewed allergies, medications, past medical, surgical, family, and social history.   Review of Systems Pertinent positives and negatives in the history of present illness.     Objective:   Physical Exam Wt 260 lb (117.9 kg)   BMI 40.42 kg/m   Alert and oriented and in no acute distress.  Respirations unlabored.  Normal speech, mood and thought process.      Assessment & Plan:  Acute cystitis without hematuria  I will treat her with an antibiotic. She may take AZO today. Increase water. If she gets worse or is not back to baseline she will need to be seen.

## 2019-12-19 ENCOUNTER — Other Ambulatory Visit: Payer: Self-pay

## 2019-12-19 ENCOUNTER — Encounter (INDEPENDENT_AMBULATORY_CARE_PROVIDER_SITE_OTHER): Payer: Self-pay | Admitting: Bariatrics

## 2019-12-19 ENCOUNTER — Ambulatory Visit (INDEPENDENT_AMBULATORY_CARE_PROVIDER_SITE_OTHER): Payer: BC Managed Care – PPO | Admitting: Bariatrics

## 2019-12-19 VITALS — BP 143/78 | HR 77 | Temp 98.4°F | Ht 67.0 in | Wt 266.0 lb

## 2019-12-19 DIAGNOSIS — E559 Vitamin D deficiency, unspecified: Secondary | ICD-10-CM

## 2019-12-19 DIAGNOSIS — R5383 Other fatigue: Secondary | ICD-10-CM

## 2019-12-19 DIAGNOSIS — R7303 Prediabetes: Secondary | ICD-10-CM

## 2019-12-19 DIAGNOSIS — Z0289 Encounter for other administrative examinations: Secondary | ICD-10-CM

## 2019-12-19 DIAGNOSIS — R0602 Shortness of breath: Secondary | ICD-10-CM

## 2019-12-19 DIAGNOSIS — D509 Iron deficiency anemia, unspecified: Secondary | ICD-10-CM

## 2019-12-19 DIAGNOSIS — Z1331 Encounter for screening for depression: Secondary | ICD-10-CM | POA: Diagnosis not present

## 2019-12-19 DIAGNOSIS — Z6841 Body Mass Index (BMI) 40.0 and over, adult: Secondary | ICD-10-CM

## 2019-12-19 DIAGNOSIS — Z9189 Other specified personal risk factors, not elsewhere classified: Secondary | ICD-10-CM | POA: Diagnosis not present

## 2019-12-19 DIAGNOSIS — E538 Deficiency of other specified B group vitamins: Secondary | ICD-10-CM

## 2019-12-19 NOTE — Progress Notes (Signed)
Dear Karla Blend, NP-C,   Thank you for referring Karla [name] to our clinic. The following note includes my evaluation and treatment recommendations.  Chief Complaint:   OBESITY Karla [name] (MR# [id number]) is a 39 y.o. female who presents for evaluation and treatment of obesity and related comorbidities. Current BMI is Body mass index is 41.66 kg/m.Marland Kitchen Karla [name] has been struggling with her weight for many years and has been unsuccessful in either losing weight, maintaining weight loss, or reaching her healthy weight goal.  Karla [name] is currently in the action stage of change and ready to dedicate time achieving and maintaining a healthier weight. Karla [name] is interested in becoming our patient and working on intensive lifestyle modifications including (but not limited to) diet and exercise for weight loss.  Karla [name] does not like to cook and notes time as an obstacle. She craves sweets.  Karla [name]'s habits were reviewed today and are as follows: Her family eats meals together, she thinks her family will eat healthier with her, her desired weight loss is 66 lbs, she has been heavy most of her life, she started gaining weight 3 years ago, her heaviest weight ever was 270 pounds, she craves sweets, she snacks frequently in the evenings, she skips lunch frequently, she is frequently drinking liquids with calories, she frequently makes poor food choices, she has problems with excessive hunger and she struggles with emotional eating.  Depression Screen Karla [name]'s Food and Mood (modified PHQ-9) score was 11.  Depression screen Karla [name] 2/9 12/19/2019  Decreased Interest 0  Down, Depressed, Hopeless 0  PHQ - 2 Score 0  Altered sleeping 1  Tired, decreased energy 2  Change in appetite 2  Feeling bad or failure about yourself  1  Trouble concentrating 3  Moving slowly or fidgety/restless 2  Suicidal thoughts 0  PHQ-9 Score 11  Difficult doing work/chores Not difficult at all   Subjective:   Other  fatigue. Karla [name] admits to daytime somnolence and admits to waking up still tired. Patent has a history of symptoms of daytime fatigue and Epworth sleepiness scale. She states that she does not sleep well most nights. Snoring is not present. Apneic episodes are not present. Epworth Sleepiness Score is 12.  SOB (shortness of breath) on exertion. Karla [name] notes increasing shortness of breath with certain activities and seems to be worsening over time with weight gain. She notes getting out of breath sooner with activity than she used to. This has gotten worse recently. Taneshia denies shortness of breath at rest or orthopnea.  Vitamin D deficiency. Karla [name] is taking OTC Vitamin D supplementation.    Ref. Range 09/26/2019 10:08  Vitamin D, 25-Hydroxy Latest Ref Range: 30.0 - 100.0 ng/mL 26.5 (L)   Prediabetes. Karla [name] has a diagnosis of prediabetes based on her elevated HgA1c and was informed this puts her at greater risk of developing diabetes. She continues to work on diet and exercise to decrease her risk of diabetes. She denies nausea or hypoglycemia. Karla [name] reports an increased appetite.  Lab Results  Component Value Date   HGBA1C 5.7 (H) 09/26/2019   No results found for: INSULIN  Iron deficiency anemia, unspecified iron deficiency anemia type. Karla [name] reports a history of iron infusions. She is currently not on iron supplementation. Last CBC was within normal limits.   CBC Latest Ref Rng & Units 09/26/2019 01/25/2018 01/25/2018  WBC 3.4 - 10.8 x10E3/uL 7.9 9.2 -  Hemoglobin 11.1 - 15.9 g/dL 06.2 69.4 11.0(A)  Hematocrit 34.0 - 46.6 % 36.4  34.0 -  Platelets 150 - 450 x10E3/uL 376 350 -   Lab Results  Component Value Date   IRON 67 01/25/2018   TIBC 343 01/25/2018   FERRITIN 15 01/25/2018   Lab Results  Component Value Date   VITAMINB12 295 01/25/2018   Vitamin B 12 deficiency. Karla [name] is not on B12 supplementation.   Lab Results  Component Value Date   VITAMINB12 295 01/25/2018    Depression screening. Karla [name] had a moderately positive depression screen with a PHQ-9 score of 11.  At risk of diabetes mellitus. Karla [name] is at higher than average risk for developing diabetes due to prediabetes.  Assessment/Plan:   Other fatigue. Karla [name] does feel that her weight is causing her energy to be lower than it should be. Fatigue may be related to obesity, depression or many other causes. Labs will be ordered, and in the meanwhile, Kosha will focus on self care including making healthy food choices, increasing physical activity and focusing on stress reduction. EKG 12-Lead  SOB (shortness of breath) on exertion. Karla [name] does feel that she gets out of breath more easily that she used to when she exercises. Karla [name]'s shortness of breath appears to be obesity related and exercise induced. She has agreed to work on weight loss and gradually increase exercise to treat her exercise induced shortness of breath. Will continue to monitor closely.  Vitamin D deficiency. Low Vitamin D level contributes to fatigue and are associated with obesity, breast, and colon cancer. VITAMIN D 25 Hydroxy (Vit-D Deficiency, Fractures) level will be checked today.  Prediabetes. Karla [name] will continue to work on weight loss, exercise, and decreasing simple carbohydrates to help decrease the risk of diabetes. Insulin, random will be checked today. Will consider metformin if insulin is high.  Iron deficiency anemia, unspecified iron deficiency anemia type. Orders and follow up as documented in patient record. Will watch over time.  Counseling . Iron is essential for our bodies to make red blood cells.  Reasons that someone may be deficient include: an iron-deficient diet (more likely in those following vegan or vegetarian diets), women with heavy menses, patients with GI disorders or poor absorption, patients that have had bariatric surgery, frequent blood donors, patients with cancer, and patients with heart disease.    Marland Kitchen. An iron supplement has been recommended. This is found over-the-counter.  Karla [name]. Iron-rich foods include dark leafy greens, red and white meats, eggs, seafood, and beans.   . Certain foods and drinks prevent your body from absorbing iron properly. Avoid eating these foods in the same meal as iron-rich foods or with iron supplements. These foods include: coffee, black tea, and red wine; milk, dairy products, and foods that are high in calcium; beans and soybeans; whole grains.  . Constipation can be a side effect of iron supplementation. Increased water and fiber intake are helpful. Water goal: > 2 liters/day. Fiber goal: > 25 grams/day.  Vitamin B 12 deficiency. The diagnosis was reviewed with the patient. Counseling provided today, see below. We will continue to monitor. Orders and follow up as documented in patient record. Vitamin B12 will be checked today.  Counseling . The body needs vitamin B12: to make red blood cells; to make DNA; and to help the nerves work properly so they can carry messages from the brain to the body.  . The main causes of vitamin B12 deficiency include dietary deficiency, digestive diseases, pernicious anemia, and having a surgery in which part of the stomach or small intestine is removed.  .Marland Kitchen  Certain medicines can make it harder for the body to absorb vitamin B12. These medicines include: heartburn medications; some antibiotics; some medications used to treat diabetes, gout, and high cholesterol.  . In some cases, there are no symptoms of this condition. If the condition leads to anemia or nerve damage, various symptoms can occur, such as weakness or fatigue, shortness of breath, and numbness or tingling in your hands and feet.   . Treatment:  o May include taking vitamin B12 supplements.  o Avoid alcohol.  o Eat lots of healthy foods that contain vitamin B12: - Beef, pork, chicken, [place], and organ meats, such as liver.  - Seafood: This includes clams, rainbow trout,  salmon, tuna, and haddock.  - Eggs.  - Cereal and dairy products that are fortified: This means that vitamin B12 has been added to the food.    Depression screening. Cheyrl had a positive depression screening. Depression is commonly associated with obesity and often results in emotional eating behaviors. We will monitor this closely and work on CBT to help improve the non-hunger eating patterns. Referral to Psychology may be required if no improvement is seen as she continues in our clinic.  At risk of diabetes mellitus. Negin was given approximately 15 minutes of diabetes education and counseling today. We discussed intensive lifestyle modifications today with an emphasis on weight loss as well as increasing exercise and decreasing simple carbohydrates in her diet. We also reviewed medication options with an emphasis on risk versus benefit of those discussed.   Repetitive spaced learning was employed today to elicit superior memory formation and behavioral change.  Class 3 severe obesity with serious comorbidity and body mass index (BMI) of 40.0 to 44.9 in adult, unspecified obesity type (HCC).  Gilberte is currently in the action stage of change and her goal is to continue with weight loss efforts. I recommend Lyfe begin the structured treatment plan as follows:  She has agreed to the Category 1 Plan.  She will work on meal planning, intentional eating, and cut out all sugary drinks.  We reviewed with the patient labs from 09/26/2019 including BMP, lipids, Vitamin D, CBC, A1c, and thyroid panel.  Exercise goals: All adults should avoid inactivity. Some physical activity is better than none, and adults who participate in any amount of physical activity gain some health benefits.   Behavioral modification strategies: increasing lean protein intake, decreasing simple carbohydrates, increasing vegetables, increasing water intake, decreasing eating out, no skipping meals, meal planning and  cooking strategies, keeping healthy foods in the home and planning for success.  She was informed of the importance of frequent follow-up visits to maximize her success with intensive lifestyle modifications for her multiple health conditions. She was informed we would discuss her lab results at her next visit unless there is a critical issue that needs to be addressed sooner. Analyn agreed to keep her next visit at the agreed upon time to discuss these results.  Objective:   Blood pressure (!) 143/78, pulse 77, temperature 98.4 F (36.9 C), height 5\' 7"  (1.702 m), weight 266 lb (120.7 kg), SpO2 99 %. Body mass index is 41.66 kg/m.  EKG: Normal sinus rhythm, rate 66 BPM. Within normal limits.  Indirect Calorimeter completed today shows a VO2 of 209 and a REE of 1453.  Her calculated basal metabolic rate is thus her basal metabolic rate is worse than expected.  General: Cooperative, alert, well developed, in no acute distress. HEENT: Conjunctivae and lids unremarkable. Cardiovascular: Regular rhythm.  Lungs: Normal work of breathing. Neurologic: No focal deficits.   Lab Results  Component Value Date   CREATININE 0.81 09/26/2019   BUN 8 09/26/2019   NA 139 09/26/2019   K 4.3 09/26/2019   CL 105 09/26/2019   CO2 21 09/26/2019   Lab Results  Component Value Date   ALT 7 09/26/2019   AST 13 09/26/2019   ALKPHOS 113 09/26/2019   BILITOT 0.3 09/26/2019   Lab Results  Component Value Date   HGBA1C 5.7 (H) 09/26/2019   HGBA1C 5.8 (A) 01/25/2018   HGBA1C 5.4 09/01/2016   No results found for: INSULIN Lab Results  Component Value Date   TSH 1.630 09/26/2019   Lab Results  Component Value Date   CHOL 144 09/26/2019   HDL 35 (L) 09/26/2019   LDLCALC 87 09/26/2019   TRIG 119 09/26/2019   CHOLHDL 4.1 09/26/2019   Lab Results  Component Value Date   WBC 7.9 09/26/2019   HGB 11.5 09/26/2019   HCT 36.4 09/26/2019   MCV 87 09/26/2019   PLT 376 09/26/2019   Lab Results   Component Value Date   IRON 67 01/25/2018   TIBC 343 01/25/2018   FERRITIN 15 01/25/2018   Attestation Statements:   Reviewed by clinician on day of visit: allergies, medications, problem list, medical history, surgical history, family history, social history, and previous encounter notes.  Fernanda Drum, am acting as Energy manager for [employer], DO   I have reviewed the above documentation for accuracy and completeness, and I agree with the above. Corinna Capra, DO

## 2019-12-20 LAB — INSULIN, RANDOM: INSULIN: 30.7 u[IU]/mL — ABNORMAL HIGH (ref 2.6–24.9)

## 2019-12-20 LAB — VITAMIN D 25 HYDROXY (VIT D DEFICIENCY, FRACTURES): Vit D, 25-Hydroxy: 25.4 ng/mL — ABNORMAL LOW (ref 30.0–100.0)

## 2019-12-20 LAB — VITAMIN B12: Vitamin B-12: 283 pg/mL (ref 232–1245)

## 2020-01-02 ENCOUNTER — Ambulatory Visit (INDEPENDENT_AMBULATORY_CARE_PROVIDER_SITE_OTHER): Payer: BC Managed Care – PPO | Admitting: Bariatrics

## 2020-01-02 ENCOUNTER — Encounter (INDEPENDENT_AMBULATORY_CARE_PROVIDER_SITE_OTHER): Payer: Self-pay

## 2020-01-11 ENCOUNTER — Ambulatory Visit: Payer: BC Managed Care – PPO | Admitting: Family Medicine

## 2020-01-11 ENCOUNTER — Other Ambulatory Visit: Payer: Self-pay

## 2020-01-11 ENCOUNTER — Encounter: Payer: Self-pay | Admitting: Family Medicine

## 2020-01-11 VITALS — BP 140/90 | HR 78 | Temp 98.4°F | Wt 272.8 lb

## 2020-01-11 DIAGNOSIS — R300 Dysuria: Secondary | ICD-10-CM

## 2020-01-11 DIAGNOSIS — F410 Panic disorder [episodic paroxysmal anxiety] without agoraphobia: Secondary | ICD-10-CM

## 2020-01-11 DIAGNOSIS — F411 Generalized anxiety disorder: Secondary | ICD-10-CM

## 2020-01-11 DIAGNOSIS — R350 Frequency of micturition: Secondary | ICD-10-CM

## 2020-01-11 DIAGNOSIS — N3000 Acute cystitis without hematuria: Secondary | ICD-10-CM | POA: Diagnosis not present

## 2020-01-11 LAB — POCT URINALYSIS DIP (PROADVANTAGE DEVICE)
Bilirubin, UA: NEGATIVE
Glucose, UA: NEGATIVE mg/dL
Ketones, POC UA: NEGATIVE mg/dL
Nitrite, UA: NEGATIVE
Protein Ur, POC: NEGATIVE mg/dL
Specific Gravity, Urine: 1.03
Urobilinogen, Ur: NEGATIVE
pH, UA: 6 (ref 5.0–8.0)

## 2020-01-11 MED ORDER — SERTRALINE HCL 50 MG PO TABS: 50.0000 mg | ORAL_TABLET | Freq: Every day | ORAL | 2 refills | Status: DC

## 2020-01-11 MED ORDER — ALPRAZOLAM 0.25 MG PO TABS: 0.2500 mg | ORAL_TABLET | Freq: Two times a day (BID) | ORAL | 0 refills | Status: DC | PRN

## 2020-01-11 MED ORDER — SULFAMETHOXAZOLE-TRIMETHOPRIM 800-160 MG PO TABS: 1.0000 | ORAL_TABLET | Freq: Two times a day (BID) | ORAL | 0 refills | Status: DC

## 2020-01-11 NOTE — Progress Notes (Signed)
Subjective:  Karla [name] is a 39 y.o. female who complains of possible urinary tract infection.  She has had symptoms for 2 weeks.  Symptoms include Dysuria, urinary frequency and urgency as well as suprapubic pressure. Patient denies Fever, chills, back pain, nausea, vomiting.  Last UTI was 12/02/2019   Using  for current symptoms.    She was prescribed Macrobid approximately 3 weeks ago for UTI symptoms.  This was done during a virtual visit due to her being at the beach and unable to come in.  Felt that she got at least 80% better but then her symptoms worsened again.  Patient Does not have a history of recurrent UTI. Patient does not have a history of pyelonephritis.  No other aggravating or relieving factors.    She also became tearful and states she stopped her Celexa because she felt that it was no longer helping with her anxiety and depression.  States Celexa did not work initially but she feels that it stopped working. states she recently started having panic attacks.  States she would like to try medication again.  States she needs help.  States she is seeing her counselor virtually every other week. Reports still having issues dealing with the death of her father 3 years ago.  States her mother told her that she overanalyzes everything and over thinks things.  She is currently working 3 jobs to keep her self busy.  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    Past Medical History:  Diagnosis Date  . Abnormal Pap smear   . Anxiety   . Depressed   . GERD (gastroesophageal reflux disease)   . H/O transfusion of packed red blood cells   . Herpes   . Iron deficiency anemia    since 39 y.o.    . Low vitamin D level   . Pre-diabetes     ROS as in subjective  Reviewed allergies, medications, past medical, surgical, and social history.    Objective: Vitals:   01/11/20 1622  BP: 140/90  Pulse: 78  Temp: 98.4 F (36.9 C)    General appearance: alert, no distress,  WD/WN, female Abdomen: +bs, soft, non tender, non distended, no masses, no hepatomegaly, no splenomegaly, no bruits Back: no CVA tenderness GU: declines      Laboratory:  Urine dipstick: 3+ for leukocyte esterase.       Assessment: Acute cystitis without hematuria - Plan: sulfamethoxazole-trimethoprim (BACTRIM DS) 800-160 MG tablet, Urine Culture, POCT Urinalysis DIP (Proadvantage Device)  GAD (generalized anxiety disorder) - Plan: sertraline (ZOLOFT) 50 MG tablet, ALPRAZolam (XANAX) 0.25 MG tablet  Panic attacks - Plan: sertraline (ZOLOFT) 50 MG tablet, ALPRAZolam (XANAX) 0.25 MG tablet  Dysuria - Plan: POCT Urinalysis DIP (Proadvantage Device)  Urinary frequency - Plan: POCT Urinalysis DIP (Proadvantage Device)    Plan: Discussed symptoms, diagnosis, possible complications, and usual course of illness.  Bactrim prescribed.  advised increased water intake, can use OTC Tylenol for pain.  Urine culture sent.  She stopped Celexa several weeks ago but did not tell me.  I will start her on sertraline and also prescribe alprazolam to use as needed for panic. Discussed taking 1/2 tablet of sertraline for the first week. Discussed using caution with alprazolam and that this is for short term use only.  Continue counseling.  Recommend that she let me know if the medication is not helping or if she is having side effects.  Discussed that we can increase her dose gradually as  needed.  We may also consider adding Wellbutrin at some point.  She will follow-up in 1 to 2 weeks for anxiety.

## 2020-01-14 LAB — URINE CULTURE

## 2020-01-19 ENCOUNTER — Telehealth: Payer: BC Managed Care – PPO | Admitting: Family Medicine

## 2020-01-19 ENCOUNTER — Other Ambulatory Visit: Payer: Self-pay

## 2020-01-19 ENCOUNTER — Encounter: Payer: Self-pay | Admitting: Family Medicine

## 2020-01-19 VITALS — Wt 260.0 lb

## 2020-01-19 DIAGNOSIS — L989 Disorder of the skin and subcutaneous tissue, unspecified: Secondary | ICD-10-CM

## 2020-01-19 DIAGNOSIS — N3000 Acute cystitis without hematuria: Secondary | ICD-10-CM

## 2020-01-19 DIAGNOSIS — F410 Panic disorder [episodic paroxysmal anxiety] without agoraphobia: Secondary | ICD-10-CM | POA: Insufficient documentation

## 2020-01-19 DIAGNOSIS — F411 Generalized anxiety disorder: Secondary | ICD-10-CM

## 2020-01-19 NOTE — Progress Notes (Signed)
   Subjective:  Documentation for virtual audio and video telecommunications through [place] encounter:  The patient was located at work. 2 patient identifiers used.  The provider was located in the office. The patient did consent to this visit and is aware of possible charges through their insurance for this visit.  The other persons participating in this telemedicine service were none. Time spent on call was 12 minutes and in review of previous records 15 minutes total.  This virtual service is not related to other E/M service within previous 7 days.   Patient ID: Karla [name], female    DOB: [birth year], 39 y.o.   MRN: [id number]  HPI  Chief Complaint  Patient presents with  . med check    med check no concerns   This is a 2 week follow up on anxiety and panic attacks. She started on Zoloft at our last visit.  Reports doing well and she can already tell a difference.  She has only need one dose of alprazolam. No side effects.   UTI symptoms have resolved.   States she developed a bump on her buttock 8 days ago. States the area is painful to touch. States the skin peels off. She is using cortisone cream for itching.  Denies fever, chills, N/V.   Reviewed allergies, medications, past medical, surgical, family, and social history.     Review of Systems Pertinent positives and negatives in the history of present illness.     Objective:   Physical Exam Wt 260 lb (117.9 kg)   BMI 40.72 kg/m   Alert and oriented and in no acute distress. Respirations unlabored. Normal mood.       Assessment & Plan:  GAD (generalized anxiety disorder) -Reports significant improvement already since being on Zoloft.  She will continue the medication.  Discussed not coming off of it unless we have a conversation about it.    Panic attacks -She took 1 dose of alprazolam shortly after starting on Zoloft but has not needed any additional doses.  Reports improvement in symptoms  Acute  cystitis without hematuria -She is almost back to baseline  Skin lesion -Unclear etiology for the skin issue on her buttock.  Discussed if this is worsening and not improving in the next few days or if she starts to feel ill, she will need to come in to be seen

## 2020-07-23 LAB — HM MAMMOGRAPHY

## 2020-07-24 LAB — HM PAP SMEAR: HM Pap smear: NEGATIVE

## 2020-07-24 LAB — RESULTS CONSOLE HPV: CHL HPV: NEGATIVE

## 2022-01-08 ENCOUNTER — Encounter (INDEPENDENT_AMBULATORY_CARE_PROVIDER_SITE_OTHER): Payer: Self-pay

## 2022-03-26 ENCOUNTER — Encounter: Payer: Self-pay | Admitting: Medical

## 2022-03-26 ENCOUNTER — Ambulatory Visit: Payer: BC Managed Care – PPO | Admitting: Medical

## 2022-03-26 VITALS — BP 110/80 | HR 86 | Ht 66.5 in | Wt 262.8 lb

## 2022-03-26 DIAGNOSIS — Z0000 Encounter for general adult medical examination without abnormal findings: Secondary | ICD-10-CM | POA: Diagnosis not present

## 2022-03-26 DIAGNOSIS — F411 Generalized anxiety disorder: Secondary | ICD-10-CM | POA: Diagnosis not present

## 2022-03-26 DIAGNOSIS — Z7185 Encounter for immunization safety counseling: Secondary | ICD-10-CM

## 2022-03-26 DIAGNOSIS — R7303 Prediabetes: Secondary | ICD-10-CM | POA: Diagnosis not present

## 2022-03-26 DIAGNOSIS — Z131 Encounter for screening for diabetes mellitus: Secondary | ICD-10-CM

## 2022-03-26 DIAGNOSIS — Z9851 Tubal ligation status: Secondary | ICD-10-CM

## 2022-03-26 DIAGNOSIS — Z13220 Encounter for screening for lipoid disorders: Secondary | ICD-10-CM

## 2022-03-26 DIAGNOSIS — Z1231 Encounter for screening mammogram for malignant neoplasm of breast: Secondary | ICD-10-CM

## 2022-03-26 DIAGNOSIS — E559 Vitamin D deficiency, unspecified: Secondary | ICD-10-CM

## 2022-03-26 DIAGNOSIS — D509 Iron deficiency anemia, unspecified: Secondary | ICD-10-CM | POA: Diagnosis not present

## 2022-03-26 DIAGNOSIS — F410 Panic disorder [episodic paroxysmal anxiety] without agoraphobia: Secondary | ICD-10-CM

## 2022-03-26 LAB — POCT URINALYSIS DIP (PROADVANTAGE DEVICE)
Bilirubin, UA: NEGATIVE
Blood, UA: NEGATIVE
Glucose, UA: NEGATIVE mg/dL
Nitrite, UA: NEGATIVE
Protein Ur, POC: NEGATIVE mg/dL
Specific Gravity, Urine: 1.03
Urobilinogen, Ur: NEGATIVE
pH, UA: 6 (ref 5.0–8.0)

## 2022-03-26 NOTE — Progress Notes (Signed)
Subjective:   HPI  Karla [name] is a 41 y.o. female who presents for Chief Complaint  Patient presents with   fasting cpe    Fasting cpe, has obgyn. Dr. Jackelyn Knife, declines flu or covid    Patient Care Team: Avanell Shackleton, NP-C as PCP - General (Nurse Practitioner) Tracey Harries, MD as Attending Physician (Obstetrics and Gynecology) Dr. Lavina Hamman, gynecology Sees dentist Sees eye doctor  Concerns: Declines flu and covid shot  Has ongoing sciatica issues, on and off the last year.  Also ongoing knee problems. Saw murphy wainer about 2 years ago, was told she had bone on bone knee issues.  Reviewed their medical, surgical, family, social, medication, and allergy history and updated chart as appropriate.  Past Medical History:  Diagnosis Date   Abnormal Pap smear    Anxiety    Depressed    GERD (gastroesophageal reflux disease)    H/O transfusion of packed red blood cells    Herpes    Iron deficiency anemia    since 41 y.o.     Low vitamin D level    Pre-diabetes     Family History  Problem Relation Age of Onset   Diabetes Father    Cancer Father        of lining of kidneys, spread to lungs, bones   AAA (abdominal aortic aneurysm) Father    High Cholesterol Mother    Diabetes Maternal Grandmother    Hypertension Maternal Grandmother    Diabetes Maternal Grandfather    Prostate cancer Maternal Grandfather    Anemia Paternal Grandmother    Cancer Paternal Grandfather        prostate   Anemia Cousin     No current outpatient medications on file.  No Known Allergies    Review of Systems Constitutional: -fever, -chills, -sweats, -unexpected weight change, -decreased appetite, -fatigue Allergy: -sneezing, -itching, -congestion Dermatology: -changing moles, --rash, -lumps ENT: -runny nose, -ear pain, -sore throat, -hoarseness, -sinus pain, -teeth pain, - ringing in ears, -hearing loss, -nosebleeds Cardiology: -chest pain, -palpitations, -swelling,  -difficulty breathing when lying flat, -waking up short of breath Respiratory: -cough, -shortness of breath, -difficulty breathing with exercise or exertion, -wheezing, -coughing up blood Gastroenterology: -abdominal pain, -nausea, -vomiting, -diarrhea, -constipation, -blood in stool, -changes in bowel movement, -difficulty swallowing or eating Hematology: -bleeding, -bruising  Musculoskeletal: +joint aches, -muscle aches, -joint swelling, +back pain, -neck pain, -cramping, -changes in gait Ophthalmology: denies vision changes, eye redness, itching, discharge Urology: -burning with urination, -difficulty urinating, -blood in urine, -urinary frequency, -urgency, -incontinence Neurology: -headache, -weakness, -tingling, -numbness, -memory loss, -falls, -dizziness Psychology: -depressed mood, -agitation, -sleep problems Breast/gyn: -breast tendnerss, -discharge, -lumps, -vaginal discharge,- irregular periods, -heavy periods      03/26/2022    2:44 PM 01/19/2020    1:30 PM 12/19/2019    7:41 AM 09/26/2019    9:28 AM 08/26/2019   11:05 AM  Depression screen PHQ 2/9  Decreased Interest 0 0 0 0 0  Down, Depressed, Hopeless 0 0 0 0 0  PHQ - 2 Score 0 0 0 0 0  Altered sleeping   1    Tired, decreased energy   2    Change in appetite   2    Feeling bad or failure about yourself    1    Trouble concentrating   3    Moving slowly or fidgety/restless   2    Suicidal thoughts   0    PHQ-9 Score  11    Difficult doing work/chores   Not difficult at all         Objective:  BP 110/80   Pulse 86   Ht 5' 6.5" (1.689 m)   Wt 262 lb 12.8 oz (119.2 kg)   BMI 41.78 kg/m   General appearance: alert, no distress, WD/WN, African American female Skin: unremarkable HEENT: normocephalic, conjunctiva/corneas normal, sclerae anicteric, PERRLA, EOMi, nares patent, no discharge or erythema, pharynx normal Oral cavity: MMM, tongue normal, teeth normal Neck: supple, no lymphadenopathy, no thyromegaly, no  masses, normal ROM, no bruits Chest: non tender, normal shape and expansion Heart: RRR, normal S1, S2, no murmurs Lungs: CTA bilaterally, no wheezes, rhonchi, or rales Abdomen: +bs, soft, non tender, non distended, no masses, no hepatomegaly, no splenomegaly, no bruits Back:  full ROM, non tender, normal ROM, no scoliosis Musculoskeletal: both knees somewhat laxity lateral stress, but no swelling, upper extremities non tender, no obvious deformity, normal ROM throughout, lower extremities non tender, no obvious deformity, normal ROM throughout Extremities: no edema, no cyanosis, no clubbing Pulses: 2+ symmetric, upper and lower extremities, normal cap refill Neurological: alert, oriented x 3, CN2-12 intact, strength normal upper extremities and lower extremities, sensation normal throughout, DTRs 2+ throughout, no cerebellar signs, gait normal Psychiatric: normal affect, behavior normal, pleasant  Breast/gyn/rectal - deferred to gynecology     Assessment and Plan :   Encounter Diagnoses  Name Primary?   Encounter for health maintenance examination in adult Yes   Iron deficiency anemia, unspecified iron deficiency anemia type    GAD (generalized anxiety disorder)    Prediabetes    Panic attacks    Vitamin D deficiency    Vaccine counseling    Encounter for screening mammogram for malignant neoplasm of breast    S/P tubal ligation    Screening for lipid disorders    Screening for diabetes mellitus      This visit was a preventative care visit, also known as wellness visit or routine physical.   Topics typically include healthy lifestyle, diet, exercise, preventative care, vaccinations, sick and well care, proper use of emergency dept and after hours care, as well as other concerns.     Recommendations: Continue to return yearly for your annual wellness and preventative care visits.  This gives [place] a chance to discuss healthy lifestyle, exercise, vaccinations, review your chart  record, and perform screenings where appropriate.  I recommend you see your eye doctor yearly for routine vision care.  I recommend you see your dentist yearly for routine dental care including hygiene visits twice yearly.   Vaccination recommendations were reviewed Immunization History  Administered Date(s) Administered   Janssen (J&J) SARS-COV-2 Vaccination 09/03/2019   Tdap 08/11/2012   You decline flu and covid shot today   Screening for cancer: Colon cancer screening: Age 73  Breast cancer screening: You should perform a self breast exam monthly.   We reviewed recommendations for regular mammograms and breast cancer screening.  Cervical cancer screening: We reviewed recommendations for pap smear screening.   Skin cancer screening: Check your skin regularly for new changes, growing lesions, or other lesions of concern Come in for evaluation if you have skin lesions of concern.  Lung cancer screening: If you have a greater than 20 pack year history of tobacco use, then you may qualify for lung cancer screening with a chest CT scan.   Please call your insurance company to inquire about coverage for this test.  We currently don't have screenings  for other cancers besides breast, cervical, colon, and lung cancers.  If you have a strong family history of cancer or have other cancer screening concerns, please let me know.    Bone health: Get at least 150 minutes of aerobic exercise weekly Get weight bearing exercise at least once weekly Bone density test:  A bone density test is an imaging test that uses a type of X-ray to measure the amount of calcium and other minerals in your bones. The test may be used to diagnose or screen you for a condition that causes weak or thin bones (osteoporosis), predict your risk for a broken bone (fracture), or determine how well your osteoporosis treatment is working. The bone density test is recommended for females 85 and older, or females  or males <24 if certain risk factors such as thyroid disease, long term use of steroids such as for asthma or rheumatological issues, vitamin D deficiency, estrogen deficiency, family history of osteoporosis, self or family history of fragility fracture in first degree relative.    Heart health: Get at least 150 minutes of aerobic exercise weekly Limit alcohol It is important to maintain a healthy blood pressure and healthy cholesterol numbers  Heart disease screening: Screening for heart disease includes screening for blood pressure, fasting lipids, glucose/diabetes screening, BMI height to weight ratio, reviewed of smoking status, physical activity, and diet.    Goals include blood pressure 120/80 or less, maintaining a healthy lipid/cholesterol profile, preventing diabetes or keeping diabetes numbers under good control, not smoking or using tobacco products, exercising most days per week or at least 150 minutes per week of exercise, and eating healthy variety of fruits and vegetables, healthy oils, and avoiding unhealthy food choices like fried food, fast food, high sugar and high cholesterol foods.    Other tests may possibly include EKG test, CT coronary calcium score, echocardiogram, exercise treadmill stress test.    Medical care options: I recommend you continue to seek care here first for routine care.  We try really hard to have available appointments Monday through Friday daytime hours for sick visits, acute visits, and physicals.  Urgent care should be used for after hours and weekends for significant issues that cannot wait till the next day.  The emergency department should be used for significant potentially life-threatening emergencies.  The emergency department is expensive, can often have long wait times for less significant concerns, so try to utilize primary care, urgent care, or telemedicine when possible to avoid unnecessary trips to the emergency department.  Virtual visits  and telemedicine have been introduced since the pandemic started in 2020, and can be convenient ways to receive medical care.  We offer virtual appointments as well to assist you in a variety of options to seek medical care.   Advanced Directives: I recommend you consider completing a [place] and Living Will.   These documents respect your wishes and help alleviate burdens on your loved ones if you were to become terminally ill or be in a position to need those documents enforced.    You can complete Advanced Directives yourself, have them notarized, then have copies made for our office, for you and for anybody you feel should have them in safe keeping.  Or, you can have an attorney prepare these documents.   If you haven't updated your Last Will and Testament in a while, it may be worthwhile having an attorney prepare these documents together and save on some costs.  Separate significant issues discussed: Routine labs today  Request last xrays and visit notes from [facility] ortho.  Continue routine exercise, stretching.  Consider gabapentin trial  Bilat chronic knee pain - request orthopedic records  BMI 41 - work on efforts to lose weight through healthy diet and exercise   Moxie was seen today for fasting cpe.  Diagnoses and all orders for this visit:  Encounter for health maintenance examination in adult -     Comprehensive metabolic panel -     CBC -     Lipid panel -     Hemoglobin A1c -     POCT Urinalysis DIP (Proadvantage Device) -     VITAMIN D 25 Hydroxy (Vit-D Deficiency, Fractures)  Iron deficiency anemia, unspecified iron deficiency anemia type  GAD (generalized anxiety disorder)  Prediabetes  Panic attacks  Vitamin D deficiency -     VITAMIN D 25 Hydroxy (Vit-D Deficiency, Fractures)  Vaccine counseling  Encounter for screening mammogram for malignant neoplasm of breast  S/P tubal ligation  Screening for lipid  disorders -     Lipid panel  Screening for diabetes mellitus -     Hemoglobin A1c    Follow-up pending labs, yearly for physical

## 2022-03-27 ENCOUNTER — Other Ambulatory Visit: Payer: Self-pay | Admitting: Medical

## 2022-03-27 LAB — CBC
Hematocrit: 33.1 % — ABNORMAL LOW (ref 34.0–46.6)
Hemoglobin: 10.8 g/dL — ABNORMAL LOW (ref 11.1–15.9)
MCH: 27.2 pg (ref 26.6–33.0)
MCHC: 32.6 g/dL (ref 31.5–35.7)
MCV: 83 fL (ref 79–97)
Platelets: 387 10*3/uL (ref 150–450)
RBC: 3.97 x10E6/uL (ref 3.77–5.28)
RDW: 15.6 % — ABNORMAL HIGH (ref 11.7–15.4)
WBC: 8.8 10*3/uL (ref 3.4–10.8)

## 2022-03-27 LAB — COMPREHENSIVE METABOLIC PANEL
ALT: 6 IU/L (ref 0–32)
AST: 14 IU/L (ref 0–40)
Albumin/Globulin Ratio: 1.4 (ref 1.2–2.2)
Albumin: 4.2 g/dL (ref 3.9–4.9)
Alkaline Phosphatase: 113 IU/L (ref 44–121)
BUN/Creatinine Ratio: 12 (ref 9–23)
BUN: 10 mg/dL (ref 6–24)
Bilirubin Total: 0.3 mg/dL (ref 0.0–1.2)
CO2: 20 mmol/L (ref 20–29)
Calcium: 9 mg/dL (ref 8.7–10.2)
Chloride: 106 mmol/L (ref 96–106)
Creatinine, Ser: 0.85 mg/dL (ref 0.57–1.00)
Globulin, Total: 3 g/dL (ref 1.5–4.5)
Glucose: 87 mg/dL (ref 70–99)
Potassium: 4.3 mmol/L (ref 3.5–5.2)
Sodium: 139 mmol/L (ref 134–144)
Total Protein: 7.2 g/dL (ref 6.0–8.5)
eGFR: 88 mL/min/{1.73_m2} (ref >59)

## 2022-03-27 LAB — LIPID PANEL
Chol/HDL Ratio: 3.8 ratio (ref 0.0–4.4)
Cholesterol, Total: 142 mg/dL (ref 100–199)
HDL: 37 mg/dL — ABNORMAL LOW (ref >39)
LDL Chol Calc (NIH): 90 mg/dL (ref 0–99)
Triglycerides: 74 mg/dL (ref 0–149)
VLDL Cholesterol Cal: 15 mg/dL (ref 5–40)

## 2022-03-27 LAB — VITAMIN D 25 HYDROXY (VIT D DEFICIENCY, FRACTURES): Vit D, 25-Hydroxy: 19.2 ng/mL — ABNORMAL LOW (ref 30.0–100.0)

## 2022-03-27 LAB — HEMOGLOBIN A1C
Est. average glucose Bld gHb Est-mCnc: 126 mg/dL
Hgb A1c MFr Bld: 6 % — ABNORMAL HIGH (ref 4.8–5.6)

## 2022-03-27 MED ORDER — VITAMIN D 50 MCG (2000 UT) PO CAPS: 1.0000 | ORAL_CAPSULE | Freq: Every day | ORAL | 3 refills | Status: DC

## 2022-03-27 MED ORDER — PRENATAL VITAMIN 27-0.8 MG PO TABS: 1.0000 | ORAL_TABLET | Freq: Every day | ORAL | 3 refills | Status: DC

## 2022-04-16 ENCOUNTER — Encounter: Payer: Self-pay | Admitting: Internal Medicine

## 2022-04-17 ENCOUNTER — Telehealth: Payer: Self-pay | Admitting: Medical

## 2022-04-17 NOTE — Telephone Encounter (Signed)
[facility] OBGYN records received

## 2022-04-17 NOTE — Telephone Encounter (Signed)
Received response to medical records request we sent. Per response they do not have any record pf patient being seen at that facility.

## 2022-04-18 ENCOUNTER — Encounter: Payer: Self-pay | Admitting: Internal Medicine

## 2022-04-18 ENCOUNTER — Encounter: Payer: Self-pay | Admitting: Family Medicine

## 2023-03-02 LAB — RESULTS CONSOLE HPV: CHL HPV: NEGATIVE

## 2023-03-02 LAB — HM MAMMOGRAPHY

## 2023-03-02 LAB — HM PAP SMEAR: HM Pap smear: NEGATIVE

## 2023-03-03 ENCOUNTER — Encounter: Payer: Self-pay | Admitting: Internal Medicine

## 2023-03-05 ENCOUNTER — Encounter: Payer: Self-pay | Admitting: Internal Medicine

## 2023-04-02 ENCOUNTER — Encounter: Payer: Self-pay | Admitting: Medical

## 2023-04-02 ENCOUNTER — Ambulatory Visit: Payer: BC Managed Care – PPO | Admitting: Medical

## 2023-04-02 VITALS — BP 122/80 | HR 73 | Ht 67.5 in | Wt 273.2 lb

## 2023-04-02 DIAGNOSIS — D509 Iron deficiency anemia, unspecified: Secondary | ICD-10-CM | POA: Diagnosis not present

## 2023-04-02 DIAGNOSIS — R635 Abnormal weight gain: Secondary | ICD-10-CM

## 2023-04-02 DIAGNOSIS — F411 Generalized anxiety disorder: Secondary | ICD-10-CM | POA: Diagnosis not present

## 2023-04-02 DIAGNOSIS — E559 Vitamin D deficiency, unspecified: Secondary | ICD-10-CM

## 2023-04-02 DIAGNOSIS — Z13220 Encounter for screening for lipoid disorders: Secondary | ICD-10-CM

## 2023-04-02 DIAGNOSIS — R0602 Shortness of breath: Secondary | ICD-10-CM

## 2023-04-02 DIAGNOSIS — R0609 Other forms of dyspnea: Secondary | ICD-10-CM

## 2023-04-02 DIAGNOSIS — G8929 Other chronic pain: Secondary | ICD-10-CM

## 2023-04-02 DIAGNOSIS — R7303 Prediabetes: Secondary | ICD-10-CM | POA: Diagnosis not present

## 2023-04-02 DIAGNOSIS — Z0000 Encounter for general adult medical examination without abnormal findings: Secondary | ICD-10-CM

## 2023-04-02 DIAGNOSIS — Z23 Encounter for immunization: Secondary | ICD-10-CM

## 2023-04-02 DIAGNOSIS — M25561 Pain in right knee: Secondary | ICD-10-CM

## 2023-04-02 DIAGNOSIS — Z7185 Encounter for immunization safety counseling: Secondary | ICD-10-CM | POA: Insufficient documentation

## 2023-04-02 LAB — POCT URINALYSIS DIP (PROADVANTAGE DEVICE)
Bilirubin, UA: NEGATIVE
Blood, UA: NEGATIVE
Glucose, UA: NEGATIVE mg/dL
Ketones, POC UA: NEGATIVE mg/dL
Nitrite, UA: NEGATIVE
Protein Ur, POC: NEGATIVE mg/dL
Specific Gravity, Urine: 1.015
Urobilinogen, Ur: NEGATIVE
pH, UA: 7 (ref 5.0–8.0)

## 2023-04-02 NOTE — Patient Instructions (Signed)
This visit was a preventative care visit, also known as wellness visit or routine physical.   Topics typically include healthy lifestyle, diet, exercise, preventative care, vaccinations, sick and well care, proper use of emergency dept and after hours care, as well as other concerns.     Recommendations: Continue to return yearly for your annual wellness and preventative care visits.  This gives [place] a chance to discuss healthy lifestyle, exercise, vaccinations, review your chart record, and perform screenings where appropriate.  I recommend you see your eye doctor yearly for routine vision care.  I recommend you see your dentist yearly for routine dental care including hygiene visits twice yearly.  See your gynecologist yearly for routine gynecological care.    Vaccination recommendations were reviewed Immunization History  Administered Date(s) Administered   Janssen (J&J) SARS-COV-2 Vaccination 09/03/2019   Tdap 08/11/2012   Counseled on the Tdap (tetanus, diptheria, and acellular pertussis) vaccine.  Vaccine information sheet given. Tdap vaccine given after consent obtained.  Declines flu shot   Screening for cancer: Colon cancer screening: Age 51  Breast cancer screening: You should perform a self breast exam monthly.   We reviewed recommendations for regular mammograms and breast cancer screening.  Cervical cancer screening: We reviewed recommendations for pap smear screening.   Skin cancer screening: Check your skin regularly for new changes, growing lesions, or other lesions of concern Come in for evaluation if you have skin lesions of concern.  Lung cancer screening: If you have a greater than 20 pack year history of tobacco use, then you may qualify for lung cancer screening with a chest CT scan.   Please call your insurance company to inquire about coverage for this test.  We currently don't have screenings for other cancers besides breast, cervical, colon, and  lung cancers.  If you have a strong family history of cancer or have other cancer screening concerns, please let me know.    Bone health: Get at least 150 minutes of aerobic exercise weekly Get weight bearing exercise at least once weekly Bone density test:  A bone density test is an imaging test that uses a type of X-ray to measure the amount of calcium and other minerals in your bones. The test may be used to diagnose or screen you for a condition that causes weak or thin bones (osteoporosis), predict your risk for a broken bone (fracture), or determine how well your osteoporosis treatment is working. The bone density test is recommended for females 65 and older, or females or males <65 if certain risk factors such as thyroid disease, long term use of steroids such as for asthma or rheumatological issues, vitamin D deficiency, estrogen deficiency, family history of osteoporosis, self or family history of fragility fracture in first degree relative.    Heart health: Get at least 150 minutes of aerobic exercise weekly Limit alcohol It is important to maintain a healthy blood pressure and healthy cholesterol numbers  Heart disease screening: Screening for heart disease includes screening for blood pressure, fasting lipids, glucose/diabetes screening, BMI height to weight ratio, reviewed of smoking status, physical activity, and diet.    Goals include blood pressure 120/80 or less, maintaining a healthy lipid/cholesterol profile, preventing diabetes or keeping diabetes numbers under good control, not smoking or using tobacco products, exercising most days per week or at least 150 minutes per week of exercise, and eating healthy variety of fruits and vegetables, healthy oils, and avoiding unhealthy food choices like fried food, fast food, high sugar and  high cholesterol foods.    Other tests may possibly include EKG test, CT coronary calcium score, echocardiogram, exercise treadmill stress test.     Medical care options: I recommend you continue to seek care here first for routine care.  We try really hard to have available appointments Monday through Friday daytime hours for sick visits, acute visits, and physicals.  Urgent care should be used for after hours and weekends for significant issues that cannot wait till the next day.  The emergency department should be used for significant potentially life-threatening emergencies.  The emergency department is expensive, can often have long wait times for less significant concerns, so try to utilize primary care, urgent care, or telemedicine when possible to avoid unnecessary trips to the emergency department.  Virtual visits and telemedicine have been introduced since the pandemic started in 2020, and can be convenient ways to receive medical care.  We offer virtual appointments as well to assist you in a variety of options to seek medical care.   Advanced Directives: I recommend you consider completing a Health Care Power of Attorney and Living Will.   These documents respect your wishes and help alleviate burdens on your loved ones if you were to become terminally ill or be in a position to need those documents enforced.    You can complete Advanced Directives yourself, have them notarized, then have copies made for our office, for you and for anybody you feel should have them in safe keeping.  Or, you can have an attorney prepare these documents.   If you haven't updated your Last Will and Testament in a while, it may be worthwhile having an attorney prepare these documents together and save on some costs.       Separate significant issues discussed: Routine labs today  Regarding shortness of breath, this could be related to heart, lungs, weight gain or other. Please go to [facility] for your chest xray.   Their hours are 8am - 4:30 pm Monday - Friday.  Take your insurance card with you.  [facility] [phone]  [address] [place], [place] [address]   Prediabetes - work on health eating habits, regular exercise, weight loss, and we will update labs today  Vitamin D deficiency - updated labs today  Weight gain - check insurance coverage for the following weight loss medications, Qsymia, Contrave, Wegovy, Zepbound  chronic right knee pain - referral back to ortho placed today

## 2023-04-02 NOTE — Progress Notes (Signed)
Subjective:   HPI  Karla [name] is a 42 y.o. female who presents for Chief Complaint  Patient presents with   Annual Exam    Fasting cpe, right leg swelling, hearing it pop a lot. Having some SOB- been going on for a couple months and has rattle cough    Patient Care Team: Bristyn Kulesza, Kermit Balo, PA-C as PCP - General (Family Medicine) Tracey Harries, MD as Attending Physician (Obstetrics and Gynecology) Dr. Lavina Hamman, gynecology Sees dentist Sees eye doctor  Concerns: Is interested in medicaiton to help with weight loss.  Has been trying to exercise, but having right knee issues  Feels stressed a lot with work.   Been having right knee pain x a few year.  Has seen [facility] for this prior.  Moved a month ago and this aggravated her knee.  Been using some Aleve arthritis.  Has ongoing sciatica issues, on and off the last year.    Been feeling a little SOB with walking.  Walks the same distance from park to job, roughly 9 minutes for several years, but lately gets SOB.   No chest pain.  No hx/o asthma, nonsmoker.  Gets rattly sound in chest with laughing or coughing.   Reviewed their medical, surgical, family, social, medication, and allergy history and updated chart as appropriate.  Past Medical History:  Diagnosis Date   Abnormal Pap smear    Anxiety    Depressed    GERD (gastroesophageal reflux disease)    H/O transfusion of packed red blood cells    Herpes    Iron deficiency anemia    since 42 y.o.     Low vitamin D level    Pre-diabetes     Family History  Problem Relation Age of Onset   Diabetes Father    Cancer Father        of lining of kidneys, spread to lungs, bones   AAA (abdominal aortic aneurysm) Father    High Cholesterol Mother    Diabetes Maternal Grandmother    Hypertension Maternal Grandmother    Diabetes Maternal Grandfather    Prostate cancer Maternal Grandfather    Anemia Paternal Grandmother    Cancer Paternal Grandfather         prostate   Anemia Cousin      Current Outpatient Medications:    Cholecalciferol (VITAMIN D) 50 MCG (2000 UT) CAPS, Take 1 capsule (2,000 Units total) by mouth daily., Disp: 90 capsule, Rfl: 3   Prenatal Vit-Fe Fumarate-FA (PRENATAL VITAMIN) 27-0.8 MG TABS, Take 1 tablet by mouth daily., Disp: 30 tablet, Rfl: 3  No Known Allergies   Review of Systems  Constitutional:  Negative for chills, fever, malaise/fatigue and weight loss.  HENT:  Negative for congestion, ear pain, hearing loss, sore throat and tinnitus.   Eyes:  Negative for blurred vision, pain and redness.  Respiratory:  Positive for shortness of breath. Negative for cough and hemoptysis.   Cardiovascular:  Negative for chest pain, palpitations, orthopnea, claudication and leg swelling.  Gastrointestinal:  Negative for abdominal pain, blood in stool, constipation, diarrhea, nausea and vomiting.  Genitourinary:  Negative for dysuria, flank pain, frequency, hematuria and urgency.  Musculoskeletal:  Positive for joint pain. Negative for falls and myalgias.  Skin:  Negative for itching and rash.  Neurological:  Negative for dizziness, tingling, speech change, weakness and headaches.  Endo/Heme/Allergies:  Negative for polydipsia. Does not bruise/bleed easily.  Psychiatric/Behavioral:  Negative for depression and memory loss. The patient is not  nervous/anxious and does not have insomnia.         04/02/2023    8:31 AM 03/26/2022    2:44 PM 01/19/2020    1:30 PM 12/19/2019    7:41 AM 09/26/2019    9:28 AM  Depression screen PHQ 2/9  Decreased Interest 0 0 0 0 0  Down, Depressed, Hopeless 0 0 0 0 0  PHQ - 2 Score 0 0 0 0 0  Altered sleeping    1   Tired, decreased energy    2   Change in appetite    2   Feeling bad or failure about yourself     1   Trouble concentrating    3   Moving slowly or fidgety/restless    2   Suicidal thoughts    0   PHQ-9 Score    11   Difficult doing work/chores    Not difficult at all         Objective:  BP 122/80   Pulse 73   Ht 5' 7.5" (1.715 m)   Wt 273 lb 3.2 oz (123.9 kg)   SpO2 96%   BMI 42.16 kg/m   Wt Readings from Last 3 Encounters:  04/02/23 273 lb 3.2 oz (123.9 kg)  03/26/22 262 lb 12.8 oz (119.2 kg)  01/19/20 260 lb (117.9 kg)    General appearance: alert, no distress, WD/WN, African American female Skin: unremarkable HEENT: normocephalic, conjunctiva/corneas normal, sclerae anicteric, PERRLA, EOMi, nares patent, no discharge or erythema, pharynx normal Oral cavity: MMM, tongue normal, teeth normal Neck: supple, no lymphadenopathy, no thyromegaly, no masses, normal ROM, no bruits Chest: non tender, normal shape and expansion Heart: RRR, normal S1, S2, no murmurs Lungs: CTA bilaterally, no wheezes, rhonchi, or rales Abdomen: +bs, soft, non tender, non distended, no masses, no hepatomegaly, no splenomegaly, no bruits Back:  full ROM, non tender, normal ROM, no scoliosis Musculoskeletal: right knee tender joint line medial and latera, both knees somewhat laxity lateral stress, but no swelling, no other tendnerss of right knee, upper extremities non tender, no obvious deformity, normal ROM throughout, lower extremities non tender, no obvious deformity, normal ROM throughout Extremities: no edema, no cyanosis, no clubbing Pulses: 2+ symmetric, upper and lower extremities, normal cap refill Neurological: alert, oriented x 3, CN2-12 intact, strength normal upper extremities and lower extremities, sensation normal throughout, DTRs 2+ throughout, no cerebellar signs, gait normal Psychiatric: normal affect, behavior normal, pleasant  Breast/gyn/rectal - deferred to gynecology   EKG reviewed    Assessment and Plan :   Encounter Diagnoses  Name Primary?   Encounter for health maintenance examination in adult Yes   Vaccine counseling    Encounter for lipid screening for cardiovascular disease    Vitamin D deficiency    GAD (generalized anxiety disorder)     Iron deficiency anemia, unspecified iron deficiency anemia type    Prediabetes    DOE (dyspnea on exertion)    SOB (shortness of breath)    Chronic pain of right knee    Weight gain     This visit was a preventative care visit, also known as wellness visit or routine physical.   Topics typically include healthy lifestyle, diet, exercise, preventative care, vaccinations, sick and well care, proper use of emergency dept and after hours care, as well as other concerns.     Recommendations: Continue to return yearly for your annual wellness and preventative care visits.  This gives [place] a chance to discuss healthy lifestyle, exercise,  vaccinations, review your chart record, and perform screenings where appropriate.  I recommend you see your eye doctor yearly for routine vision care.  I recommend you see your dentist yearly for routine dental care including hygiene visits twice yearly.  See your gynecologist yearly for routine gynecological care.    Vaccination recommendations were reviewed Immunization History  Administered Date(s) Administered   Janssen (J&J) SARS-COV-2 Vaccination 09/03/2019   Tdap 08/11/2012   Counseled on the Tdap (tetanus, diptheria, and acellular pertussis) vaccine.  Vaccine information sheet given. Tdap vaccine given after consent obtained.  Declines flu shot   Screening for cancer: Colon cancer screening: Age 67  Breast cancer screening: You should perform a self breast exam monthly.   We reviewed recommendations for regular mammograms and breast cancer screening.  Cervical cancer screening: We reviewed recommendations for pap smear screening.   Skin cancer screening: Check your skin regularly for new changes, growing lesions, or other lesions of concern Come in for evaluation if you have skin lesions of concern.  Lung cancer screening: If you have a greater than 20 pack year history of tobacco use, then you may qualify for lung cancer screening  with a chest CT scan.   Please call your insurance company to inquire about coverage for this test.  We currently don't have screenings for other cancers besides breast, cervical, colon, and lung cancers.  If you have a strong family history of cancer or have other cancer screening concerns, please let me know.    Bone health: Get at least 150 minutes of aerobic exercise weekly Get weight bearing exercise at least once weekly Bone density test:  A bone density test is an imaging test that uses a type of X-ray to measure the amount of calcium and other minerals in your bones. The test may be used to diagnose or screen you for a condition that causes weak or thin bones (osteoporosis), predict your risk for a broken bone (fracture), or determine how well your osteoporosis treatment is working. The bone density test is recommended for females 65 and older, or females or males <65 if certain risk factors such as thyroid disease, long term use of steroids such as for asthma or rheumatological issues, vitamin D deficiency, estrogen deficiency, family history of osteoporosis, self or family history of fragility fracture in first degree relative.    Heart health: Get at least 150 minutes of aerobic exercise weekly Limit alcohol It is important to maintain a healthy blood pressure and healthy cholesterol numbers  Heart disease screening: Screening for heart disease includes screening for blood pressure, fasting lipids, glucose/diabetes screening, BMI height to weight ratio, reviewed of smoking status, physical activity, and diet.    Goals include blood pressure 120/80 or less, maintaining a healthy lipid/cholesterol profile, preventing diabetes or keeping diabetes numbers under good control, not smoking or using tobacco products, exercising most days per week or at least 150 minutes per week of exercise, and eating healthy variety of fruits and vegetables, healthy oils, and avoiding unhealthy food  choices like fried food, fast food, high sugar and high cholesterol foods.    Other tests may possibly include EKG test, CT coronary calcium score, echocardiogram, exercise treadmill stress test.    Medical care options: I recommend you continue to seek care here first for routine care.  We try really hard to have available appointments Monday through Friday daytime hours for sick visits, acute visits, and physicals.  Urgent care should be used for after hours and weekends  for significant issues that cannot wait till the next day.  The emergency department should be used for significant potentially life-threatening emergencies.  The emergency department is expensive, can often have long wait times for less significant concerns, so try to utilize primary care, urgent care, or telemedicine when possible to avoid unnecessary trips to the emergency department.  Virtual visits and telemedicine have been introduced since the pandemic started in 2020, and can be convenient ways to receive medical care.  We offer virtual appointments as well to assist you in a variety of options to seek medical care.   Advanced Directives: I recommend you consider completing a Health Care Power of Attorney and Living Will.   These documents respect your wishes and help alleviate burdens on your loved ones if you were to become terminally ill or be in a position to need those documents enforced.    You can complete Advanced Directives yourself, have them notarized, then have copies made for our office, for you and for anybody you feel should have them in safe keeping.  Or, you can have an attorney prepare these documents.   If you haven't updated your Last Will and Testament in a while, it may be worthwhile having an attorney prepare these documents together and save on some costs.       Separate significant issues discussed: Routine labs today  Regarding shortness of breath, this could be related to heart, lungs, weight  gain or other. Please go to [facility] for your chest xray.   Their hours are 8am - 4:30 pm Monday - Friday.  Take your insurance card with you.  [facility] [phone]  [address] [place], [place] [address]   Prediabetes - work on health eating habits, regular exercise, weight loss, and we will update labs today  Vitamin D deficiency - updated labs today  Weight gain - check insurance coverage for the following weight loss medications, Qsymia, Contrave, Wegovy, Zepbound  chronic right knee pain - referral back to ortho placed today   Yuriko was seen today for annual exam.  Diagnoses and all orders for this visit:  Encounter for health maintenance examination in adult -     Comprehensive metabolic panel -     CBC with Differential/Platelet -     TSH -     Lipid panel -     Hemoglobin A1c -     POCT Urinalysis DIP (Proadvantage Device) -     VITAMIN D 25 Hydroxy (Vit-D Deficiency, Fractures) -     Iron, TIBC and Ferritin Panel -     Hepatitis C antibody -     EKG 12-Lead  Vaccine counseling  Encounter for lipid screening for cardiovascular disease -     Lipid panel  Vitamin D deficiency -     VITAMIN D 25 Hydroxy (Vit-D Deficiency, Fractures)  GAD (generalized anxiety disorder)  Iron deficiency anemia, unspecified iron deficiency anemia type -     CBC with Differential/Platelet -     Iron, TIBC and Ferritin Panel  Prediabetes -     Hemoglobin A1c  DOE (dyspnea on exertion) -     EKG 12-Lead -     DG Chest 2 View; Future  SOB (shortness of breath) -     EKG 12-Lead -     DG Chest 2 View; Future  Chronic pain of right knee -     AMB referral to orthopedics  Weight gain    Follow-up pending labs, yearly  for physical

## 2023-04-04 LAB — COMPREHENSIVE METABOLIC PANEL
ALT: 11 [IU]/L (ref 0–32)
AST: 14 [IU]/L (ref 0–40)
Albumin: 3.9 g/dL (ref 3.9–4.9)
Alkaline Phosphatase: 115 [IU]/L (ref 44–121)
BUN/Creatinine Ratio: 14 (ref 9–23)
BUN: 12 mg/dL (ref 6–24)
Bilirubin Total: <0.2 mg/dL (ref 0.0–1.2)
CO2: 20 mmol/L (ref 20–29)
Calcium: 8.9 mg/dL (ref 8.7–10.2)
Chloride: 106 mmol/L (ref 96–106)
Creatinine, Ser: 0.83 mg/dL (ref 0.57–1.00)
Globulin, Total: 2.8 g/dL (ref 1.5–4.5)
Glucose: 83 mg/dL (ref 70–99)
Potassium: 4.4 mmol/L (ref 3.5–5.2)
Sodium: 141 mmol/L (ref 134–144)
Total Protein: 6.7 g/dL (ref 6.0–8.5)
eGFR: 90 mL/min/{1.73_m2} (ref >59)

## 2023-04-04 LAB — CBC WITH DIFFERENTIAL/PLATELET
Basophils Absolute: 0 10*3/uL (ref 0.0–0.2)
Basos: 0 %
EOS (ABSOLUTE): 0 10*3/uL (ref 0.0–0.4)
Eos: 0 %
Hematocrit: 35.3 % (ref 34.0–46.6)
Hemoglobin: 10.5 g/dL — ABNORMAL LOW (ref 11.1–15.9)
Immature Grans (Abs): 0 10*3/uL (ref 0.0–0.1)
Immature Granulocytes: 0 %
Lymphocytes Absolute: 2.3 10*3/uL (ref 0.7–3.1)
Lymphs: 27 %
MCH: 26.1 pg — ABNORMAL LOW (ref 26.6–33.0)
MCHC: 29.7 g/dL — ABNORMAL LOW (ref 31.5–35.7)
MCV: 88 fL (ref 79–97)
Monocytes Absolute: 0.6 10*3/uL (ref 0.1–0.9)
Monocytes: 7 %
Neutrophils Absolute: 5.5 10*3/uL (ref 1.4–7.0)
Neutrophils: 66 %
Platelets: 396 10*3/uL (ref 150–450)
RBC: 4.02 x10E6/uL (ref 3.77–5.28)
RDW: 15.9 % — ABNORMAL HIGH (ref 11.7–15.4)
WBC: 8.5 10*3/uL (ref 3.4–10.8)

## 2023-04-04 LAB — VITAMIN D 25 HYDROXY (VIT D DEFICIENCY, FRACTURES): Vit D, 25-Hydroxy: 22.5 ng/mL — ABNORMAL LOW (ref 30.0–100.0)

## 2023-04-04 LAB — LIPID PANEL
Chol/HDL Ratio: 3.6 ratio (ref 0.0–4.4)
Cholesterol, Total: 145 mg/dL (ref 100–199)
HDL: 40 mg/dL (ref >39)
LDL Chol Calc (NIH): 84 mg/dL (ref 0–99)
Triglycerides: 114 mg/dL (ref 0–149)
VLDL Cholesterol Cal: 21 mg/dL (ref 5–40)

## 2023-04-04 LAB — IRON,TIBC AND FERRITIN PANEL
Ferritin: 10 ng/mL — ABNORMAL LOW (ref 15–150)
Iron Saturation: 8 % — CL (ref 15–55)
Iron: 29 ug/dL (ref 27–159)
Total Iron Binding Capacity: 366 ug/dL (ref 250–450)
UIBC: 337 ug/dL (ref 131–425)

## 2023-04-04 LAB — HEPATITIS C ANTIBODY: Hep C Virus Ab: NONREACTIVE

## 2023-04-04 LAB — HEMOGLOBIN A1C
Est. average glucose Bld gHb Est-mCnc: 131 mg/dL
Hgb A1c MFr Bld: 6.2 % — ABNORMAL HIGH (ref 4.8–5.6)

## 2023-04-04 LAB — TSH: TSH: 2.08 u[IU]/mL (ref 0.450–4.500)

## 2023-04-05 ENCOUNTER — Other Ambulatory Visit: Payer: Self-pay | Admitting: Medical

## 2023-04-05 DIAGNOSIS — D649 Anemia, unspecified: Secondary | ICD-10-CM

## 2023-04-05 MED ORDER — VITAMIN D (ERGOCALCIFEROL) 1.25 MG (50000 UNIT) PO CAPS: 50000.0000 [IU] | ORAL_CAPSULE | ORAL | 3 refills | Status: DC

## 2023-04-05 MED ORDER — FERROUS GLUCONATE 324 (38 FE) MG PO TABS: 324.0000 mg | ORAL_TABLET | Freq: Every day | ORAL | 1 refills | Status: DC

## 2023-04-05 NOTE — Progress Notes (Signed)
Results sent through MyChart

## 2023-06-22 ENCOUNTER — Ambulatory Visit: Payer: 59 | Admitting: Medical

## 2023-06-22 VITALS — BP 110/70 | HR 89 | Wt 272.6 lb

## 2023-06-22 DIAGNOSIS — D509 Iron deficiency anemia, unspecified: Secondary | ICD-10-CM | POA: Diagnosis not present

## 2023-06-22 DIAGNOSIS — E559 Vitamin D deficiency, unspecified: Secondary | ICD-10-CM

## 2023-06-22 DIAGNOSIS — G8929 Other chronic pain: Secondary | ICD-10-CM | POA: Insufficient documentation

## 2023-06-22 DIAGNOSIS — M25561 Pain in right knee: Secondary | ICD-10-CM | POA: Diagnosis not present

## 2023-06-22 DIAGNOSIS — Z6841 Body Mass Index (BMI) 40.0 and over, adult: Secondary | ICD-10-CM | POA: Insufficient documentation

## 2023-06-22 NOTE — Assessment & Plan Note (Signed)
Has follow up with [facility] this week to review MRI and recommendations

## 2023-06-22 NOTE — Assessment & Plan Note (Signed)
Counseled on diet, exercise, medicaiton.   She is activity using E2M program, is exercising although she is seeing ortho about her right knee issues .  She is interested in [employer].  She will check insurance coverage for weight loss medications.  F/u pending call back.

## 2023-06-22 NOTE — Assessment & Plan Note (Signed)
Compliant with Vit D supplement, updated labs today

## 2023-06-22 NOTE — Patient Instructions (Signed)
Please check insurance to see which of the following medicaiton are covered for weight loss.  Make sure you tell them about prediabetes as well.  Wegovy weekly injection Zepbound weekly injection Oral Qsymia Oral Contrave Generic Phentermine

## 2023-06-22 NOTE — Assessment & Plan Note (Signed)
Continue iron therapy.  Updated labs today.   Re-referral to GI for other eval.   Has hx/o endometrial ablation so not having periods.  No other known blood loss currently.

## 2023-06-22 NOTE — Progress Notes (Signed)
Subjective:  Karla [name] is a 43 y.o. female who presents for Chief Complaint  Patient presents with   Medical Management of Chronic Issues    Recheck on labs from her CPE visit, discuss weight loss medication today     Anemia - copmliant with iron daily.  No heavy periods.  No blood in stool.  Last visit reeferral was made to gi but she has been tied up with therapy and ortho appointemnts regarding her knee.   Has had to have iron infusion and hx/o bllood transfusions in the past.    Vit d deficinecy - compliant with supplement.  Concerns about weight.  Is trying to eat healthy.  Given current knee issue, not able to do a lot, but is doing 30 minutes daily of walking at home.  Gets some knee swelling and pain.  She has checked insurance and they may cover some medication.  Using E2M weight loss and fitness program.  Gets weekly meal plans, intermittent fasting, eats 11am - 7pm.   Started this 2 weeks ago  No other aggravating or relieving factors.    No other c/o.  Past Medical History:  Diagnosis Date   Abnormal Pap smear    Anxiety    Depressed    GERD (gastroesophageal reflux disease)    H/O transfusion of packed red blood cells    Herpes    Iron deficiency anemia    since 43 y.o.     Low vitamin D level    Pre-diabetes    Current Outpatient Medications on File Prior to Visit  Medication Sig Dispense Refill   ferrous gluconate (FERGON) 324 MG tablet Take 1 tablet (324 mg total) by mouth daily with breakfast. 90 tablet 1   Vitamin D, Ergocalciferol, (DRISDOL) 1.25 MG (50000 UNIT) CAPS capsule Take 1 capsule (50,000 Units total) by mouth every 7 (seven) days. 12 capsule 3   No current facility-administered medications on file prior to visit.     The following portions of the patient's history were reviewed and updated as appropriate: allergies, current medications, past family history, past medical history, past social history, past surgical history and problem  list.  ROS Otherwise as in subjective above    Objective: BP 110/70   Pulse 89   Wt 272 lb 9.6 oz (123.7 kg)   BMI 42.07 kg/m   Wt Readings from Last 3 Encounters:  06/22/23 272 lb 9.6 oz (123.7 kg)  04/02/23 273 lb 3.2 oz (123.9 kg)  03/26/22 262 lb 12.8 oz (119.2 kg)    General appearance: alert, no distress, well developed, well nourished Psych: pleasant, good eye contact, answers questions approporaktley    Assessment: Encounter Diagnoses  Name Primary?   Iron deficiency anemia, unspecified iron deficiency anemia type Yes   Vitamin D deficiency    Chronic pain of right knee    BMI 40.0-44.9, adult (HCC)      Plan: Problem List Items Addressed This Visit     Anemia, iron deficiency - Primary   Continue iron therapy.  Updated labs today.   Re-referral to GI for other eval.   Has hx/o endometrial ablation so not having periods.  No other known blood loss currently.      Relevant Orders   Ambulatory referral to Gastroenterology   CBC with Differential/Platelet   Iron, TIBC and Ferritin Panel   Vitamin D deficiency   Compliant with Vit D supplement, updated labs today      Relevant Orders   Vitamin  D, 25-hydroxy   Basic metabolic panel   Chronic pain of right knee   Has follow up with [facility] this week to review MRI and recommendations      BMI 40.0-44.9, adult (HCC)   Counseled on diet, exercise, medicaiton.   She is activity using E2M program, is exercising although she is seeing ortho about her right knee issues .  She is interested in [employer].  She will check insurance coverage for weight loss medications.  F/u pending call back.         Follow up: pending labs

## 2023-06-23 ENCOUNTER — Other Ambulatory Visit: Payer: Self-pay | Admitting: Medical

## 2023-06-23 LAB — CBC WITH DIFFERENTIAL/PLATELET
Basophils Absolute: 0 10*3/uL (ref 0.0–0.2)
Basos: 1 %
EOS (ABSOLUTE): 0 10*3/uL (ref 0.0–0.4)
Eos: 0 %
Hematocrit: 36.4 % (ref 34.0–46.6)
Hemoglobin: 11.6 g/dL (ref 11.1–15.9)
Immature Grans (Abs): 0 10*3/uL (ref 0.0–0.1)
Immature Granulocytes: 0 %
Lymphocytes Absolute: 2.6 10*3/uL (ref 0.7–3.1)
Lymphs: 34 %
MCH: 27 pg (ref 26.6–33.0)
MCHC: 31.9 g/dL (ref 31.5–35.7)
MCV: 85 fL (ref 79–97)
Monocytes Absolute: 0.6 10*3/uL (ref 0.1–0.9)
Monocytes: 8 %
Neutrophils Absolute: 4.3 10*3/uL (ref 1.4–7.0)
Neutrophils: 57 %
Platelets: 369 10*3/uL (ref 150–450)
RBC: 4.3 x10E6/uL (ref 3.77–5.28)
RDW: 15.7 % — ABNORMAL HIGH (ref 11.7–15.4)
WBC: 7.5 10*3/uL (ref 3.4–10.8)

## 2023-06-23 LAB — IRON,TIBC AND FERRITIN PANEL
Ferritin: 11 ng/mL — ABNORMAL LOW (ref 15–150)
Iron Saturation: 9 % — CL (ref 15–55)
Iron: 38 ug/dL (ref 27–159)
Total Iron Binding Capacity: 416 ug/dL (ref 250–450)
UIBC: 378 ug/dL (ref 131–425)

## 2023-06-23 LAB — BASIC METABOLIC PANEL
BUN/Creatinine Ratio: 11 (ref 9–23)
BUN: 10 mg/dL (ref 6–24)
CO2: 20 mmol/L (ref 20–29)
Calcium: 9.1 mg/dL (ref 8.7–10.2)
Chloride: 104 mmol/L (ref 96–106)
Creatinine, Ser: 0.93 mg/dL (ref 0.57–1.00)
Glucose: 87 mg/dL (ref 70–99)
Potassium: 4.2 mmol/L (ref 3.5–5.2)
Sodium: 140 mmol/L (ref 134–144)
eGFR: 78 mL/min/{1.73_m2} (ref >59)

## 2023-06-23 LAB — VITAMIN D 25 HYDROXY (VIT D DEFICIENCY, FRACTURES): Vit D, 25-Hydroxy: 23.2 ng/mL — ABNORMAL LOW (ref 30.0–100.0)

## 2023-06-23 MED ORDER — FERROUS GLUCONATE 324 (38 FE) MG PO TABS: 324.0000 mg | ORAL_TABLET | Freq: Three times a day (TID) | ORAL | 0 refills | Status: DC

## 2023-06-23 MED ORDER — VITAMIN D 50 MCG (2000 UT) PO CAPS: 1.0000 | ORAL_CAPSULE | Freq: Every day | ORAL | 1 refills | Status: DC

## 2023-06-23 NOTE — Progress Notes (Signed)
Results sent through MyChart

## 2023-07-04 HISTORY — PX: MENISCUS REPAIR: SHX5179

## 2024-04-07 ENCOUNTER — Encounter: Payer: Self-pay | Admitting: Medical

## 2024-04-07 ENCOUNTER — Ambulatory Visit: Payer: BC Managed Care – PPO | Admitting: Medical

## 2024-04-07 VITALS — BP 120/70 | HR 64 | Ht 67.5 in | Wt 280.6 lb

## 2024-04-07 DIAGNOSIS — Z0000 Encounter for general adult medical examination without abnormal findings: Secondary | ICD-10-CM

## 2024-04-07 DIAGNOSIS — R7303 Prediabetes: Secondary | ICD-10-CM

## 2024-04-07 DIAGNOSIS — Z13220 Encounter for screening for lipoid disorders: Secondary | ICD-10-CM

## 2024-04-07 DIAGNOSIS — E559 Vitamin D deficiency, unspecified: Secondary | ICD-10-CM

## 2024-04-07 DIAGNOSIS — Z2820 Immunization not carried out because of patient decision for unspecified reason: Secondary | ICD-10-CM

## 2024-04-07 DIAGNOSIS — D509 Iron deficiency anemia, unspecified: Secondary | ICD-10-CM

## 2024-04-07 DIAGNOSIS — R41840 Attention and concentration deficit: Secondary | ICD-10-CM | POA: Insufficient documentation

## 2024-04-07 DIAGNOSIS — G8929 Other chronic pain: Secondary | ICD-10-CM

## 2024-04-07 DIAGNOSIS — M25561 Pain in right knee: Secondary | ICD-10-CM

## 2024-04-07 DIAGNOSIS — Z1211 Encounter for screening for malignant neoplasm of colon: Secondary | ICD-10-CM

## 2024-04-07 LAB — MICROSCOPIC EXAMINATION

## 2024-04-07 NOTE — Patient Instructions (Signed)
 This visit was a preventative care visit, also known as wellness visit or routine physical.   Topics typically include healthy lifestyle, diet, exercise, preventative care, vaccinations, sick and well care, proper use of emergency dept and after hours care, as well as other concerns.     Recommendations: Continue to return yearly for your annual wellness and preventative care visits.  This gives us  a chance to discuss healthy lifestyle, exercise, vaccinations, review your chart record, and perform screenings where appropriate.  I recommend you see your eye doctor yearly for routine vision care.  I recommend you see your dentist yearly for routine dental care including hygiene visits twice yearly.  See your gynecologist yearly for routine gynecological care.    Vaccination recommendations were reviewed Immunization History  Administered Date(s) Administered   Janssen (J&J) SARS-COV-2 Vaccination 09/03/2019   Tdap 08/11/2012, 04/02/2023   Declines flu shot   Screening for cancer: Colon cancer screening: Age 43.  Referral to gastro  Breast cancer screening: You should perform a self breast exam monthly.   We reviewed recommendations for regular mammograms and breast cancer screening.  Cervical cancer screening: We reviewed recommendations for pap smear screening.   Skin cancer screening: Check your skin regularly for new changes, growing lesions, or other lesions of concern Come in for evaluation if you have skin lesions of concern.  Lung cancer screening: If you have a greater than 20 pack year history of tobacco use, then you may qualify for lung cancer screening with a chest CT scan.   Please call your insurance company to inquire about coverage for this test.  We currently don't have screenings for other cancers besides breast, cervical, colon, and lung cancers.  If you have a strong family history of cancer or have other cancer screening concerns, please let me know.     Bone health: Get at least 150 minutes of aerobic exercise weekly Get weight bearing exercise at least once weekly Bone density test:  A bone density test is an imaging test that uses a type of X-ray to measure the amount of calcium and other minerals in your bones. The test may be used to diagnose or screen you for a condition that causes weak or thin bones (osteoporosis), predict your risk for a broken bone (fracture), or determine how well your osteoporosis treatment is working. The bone density test is recommended for females 65 and older, or females or males <65 if certain risk factors such as thyroid disease, long term use of steroids such as for asthma or rheumatological issues, vitamin D  deficiency, estrogen deficiency, family history of osteoporosis, self or family history of fragility fracture in first degree relative.    Heart health: Get at least 150 minutes of aerobic exercise weekly Limit alcohol It is important to maintain a healthy blood pressure and healthy cholesterol numbers  Heart disease screening: Screening for heart disease includes screening for blood pressure, fasting lipids, glucose/diabetes screening, BMI height to weight ratio, reviewed of smoking status, physical activity, and diet.    Goals include blood pressure 120/80 or less, maintaining a healthy lipid/cholesterol profile, preventing diabetes or keeping diabetes numbers under good control, not smoking or using tobacco products, exercising most days per week or at least 150 minutes per week of exercise, and eating healthy variety of fruits and vegetables, healthy oils, and avoiding unhealthy food choices like fried food, fast food, high sugar and high cholesterol foods.    Other tests may possibly include EKG test, CT coronary calcium score,  echocardiogram, exercise treadmill stress test.   EKG normal 2024  Medical care options: I recommend you continue to seek care here first for routine care.  We try  really hard to have available appointments Monday through Friday daytime hours for sick visits, acute visits, and physicals.  Urgent care should be used for after hours and weekends for significant issues that cannot wait till the next day.  The emergency department should be used for significant potentially life-threatening emergencies.  The emergency department is expensive, can often have long wait times for less significant concerns, so try to utilize primary care, urgent care, or telemedicine when possible to avoid unnecessary trips to the emergency department.  Virtual visits and telemedicine have been introduced since the pandemic started in 2020, and can be convenient ways to receive medical care.  We offer virtual appointments as well to assist you in a variety of options to seek medical care.   Advanced Directives: I recommend you consider completing a Health Care Power of Attorney and Living Will.   These documents respect your wishes and help alleviate burdens on your loved ones if you were to become terminally ill or be in a position to need those documents enforced.    You can complete Advanced Directives yourself, have them notarized, then have copies made for our office, for you and for anybody you feel should have them in safe keeping.  Or, you can have an attorney prepare these documents.   If you haven't updated your Last Will and Testament in a while, it may be worthwhile having an attorney prepare these documents together and save on some costs.       Separate significant issues discussed: Iron deficiency, currently taking iron once weekly.  Referral to GI for eval  Morbid obesity  associated with prediabetes -counseled on diet, exercise -referral to weight loss study through [employer]  Prediabetes - work on health eating habits, regular exercise, weight loss, and we will update labs today  Vitamin D  deficiency - updated labs today, using low dose weekly supplement  currently  chronic right knee pain - managed by orthopedics  Attention and focus concern - advised she start with consult with counseling.

## 2024-04-07 NOTE — Progress Notes (Signed)
 Subjective:   HPI  Karla [name] is a 43 y.o. female who presents for Chief Complaint  Patient presents with   Annual Exam    Fasting cpe, declines flu shot, thinks she has ADD- trouble focusing    Patient Care Team: Manda Holstad, Alm RAMAN, PA-C as PCP - General (Family Medicine) Austin Ned, MD as Attending Physician (Obstetrics and Gynecology) Sheril Coy, MD as Consulting Physician (Orthopedic Surgery) Dr. Krystal Deaner, gynecology Sees dentist Sees eye doctor  Concerns: Karla [name] is a 43 year old female who presents for a well visit.  Has been trying to exercise, but having right knee issues  She takes vitamin D  weekly and iron once a week.  She has been receiving injections in her knees and was recently put back into physical therapy   Reviewed their medical, surgical, family, social, medication, and allergy history and updated chart as appropriate.  Past Medical History:  Diagnosis Date   Abnormal Pap smear    Anxiety    Depressed    GERD (gastroesophageal reflux disease)    H/O transfusion of packed red blood cells    Herpes    Iron deficiency anemia    since 43 y.o.     Low vitamin D  level    Pre-diabetes     Family History  Problem Relation Age of Onset   Diabetes Father    Cancer Father        of lining of kidneys, spread to lungs, bones   AAA (abdominal aortic aneurysm) Father    High Cholesterol Mother    Diabetes Maternal Grandmother    Hypertension Maternal Grandmother    Diabetes Maternal Grandfather    Prostate cancer Maternal Grandfather    Anemia Paternal Grandmother    Cancer Paternal Grandfather        prostate   Anemia Cousin      Current Outpatient Medications:    Cholecalciferol (VITAMIN D ) 50 MCG (2000 UT) CAPS, Take 1 capsule (2,000 Units total) by mouth daily. (Patient taking differently: Take 1 capsule by mouth once a week.), Disp: 90 capsule, Rfl: 1   ferrous gluconate  (FERGON) 324 MG tablet, Take 1 tablet (324 mg  total) by mouth 3 (three) times daily with meals., Disp: 270 tablet, Rfl: 0  No Known Allergies   Review of Systems  Constitutional:  Negative for chills, fever, malaise/fatigue and weight loss.  HENT:  Negative for congestion, ear pain, hearing loss, sore throat and tinnitus.   Eyes:  Negative for blurred vision, pain and redness.  Respiratory:  Negative for cough, hemoptysis and shortness of breath.   Cardiovascular:  Negative for chest pain, palpitations, orthopnea, claudication and leg swelling.  Gastrointestinal:  Negative for abdominal pain, blood in stool, constipation, diarrhea, nausea and vomiting.  Genitourinary:  Negative for dysuria, flank pain, frequency, hematuria and urgency.  Musculoskeletal:  Positive for joint pain. Negative for falls and myalgias.  Skin:  Negative for itching and rash.  Neurological:  Negative for dizziness, tingling, speech change, weakness and headaches.  Endo/Heme/Allergies:  Negative for polydipsia. Does not bruise/bleed easily.  Psychiatric/Behavioral:  Negative for depression and memory loss. The patient is not nervous/anxious and does not have insomnia.        Focus and attention issues        04/07/2024    9:21 AM 04/02/2023    8:31 AM 03/26/2022    2:44 PM 01/19/2020    1:30 PM 12/19/2019    7:41 AM  Depression screen PHQ  2/9  Decreased Interest 0 0 0 0 0  Down, Depressed, Hopeless 0 0 0 0 0  PHQ - 2 Score 0 0 0 0 0  Altered sleeping     1  Tired, decreased energy     2  Change in appetite     2  Feeling bad or failure about yourself      1  Trouble concentrating     3  Moving slowly or fidgety/restless     2  Suicidal thoughts     0  PHQ-9 Score     11  Difficult doing work/chores     Not difficult at all       Objective:  BP 120/70   Pulse 64   Ht 5' 7.5 (1.715 m)   Wt 280 lb 9.6 oz (127.3 kg)   BMI 43.30 kg/m   Wt Readings from Last 3 Encounters:  04/07/24 280 lb 9.6 oz (127.3 kg)  06/22/23 272 lb 9.6 oz (123.7 kg)   04/02/23 273 lb 3.2 oz (123.9 kg)    General appearance: alert, no distress, WD/WN, African American female Skin: unremarkable HEENT: normocephalic, conjunctiva/corneas normal, sclerae anicteric, PERRLA, EOMi, nares patent, no discharge or erythema, pharynx normal Oral cavity: MMM, tongue normal, teeth - missing some upper molars, otherwise in good repair Neck: supple, no lymphadenopathy, no thyromegaly, no masses, normal ROM, no bruits Chest: non tender, normal shape and expansion Heart: RRR, normal S1, S2, no murmurs Lungs: CTA bilaterally, no wheezes, rhonchi, or rales Abdomen: +bs, soft, non tender, non distended, no masses, no hepatomegaly, no splenomegaly, no bruits Back:   normal ROM, no scoliosis Musculoskeletal: limited exam, no obvious deformity Extremities: no edema, no cyanosis, no clubbing Pulses: 2+ symmetric, upper and lower extremities, normal cap refill Neurological: alert, oriented x 3, CN2-12 intact, strength normal upper extremities and lower extremities, sensation normal throughout, DTRs 2+ throughout, no cerebellar signs, gait normal Psychiatric: normal affect, behavior normal, pleasant  Breast/gyn/rectal - deferred to gynecology     Assessment and Plan :   Encounter Diagnoses  Name Primary?   Encounter for health maintenance examination in adult Yes   Vaccine refused by patient    Vitamin D  deficiency    Iron deficiency anemia, unspecified iron deficiency anemia type    Chronic pain of right knee    Prediabetes    Morbid obesity (HCC)    Screening for lipid disorders    Screen for colon cancer    Attention and concentration deficit     This visit was a preventative care visit, also known as wellness visit or routine physical.   Topics typically include healthy lifestyle, diet, exercise, preventative care, vaccinations, sick and well care, proper use of emergency dept and after hours care, as well as other concerns.     Recommendations: Continue to  return yearly for your annual wellness and preventative care visits.  This gives us  a chance to discuss healthy lifestyle, exercise, vaccinations, review your chart record, and perform screenings where appropriate.  I recommend you see your eye doctor yearly for routine vision care.  I recommend you see your dentist yearly for routine dental care including hygiene visits twice yearly.  See your gynecologist yearly for routine gynecological care.    Vaccination recommendations were reviewed Immunization History  Administered Date(s) Administered   Janssen (J&J) SARS-COV-2 Vaccination 09/03/2019   Tdap 08/11/2012, 04/02/2023   Declines flu shot   Screening for cancer: Colon cancer screening: Age 89.  Referral to gastro  Breast cancer screening: You should perform a self breast exam monthly.   We reviewed recommendations for regular mammograms and breast cancer screening.  Cervical cancer screening: We reviewed recommendations for pap smear screening.   Skin cancer screening: Check your skin regularly for new changes, growing lesions, or other lesions of concern Come in for evaluation if you have skin lesions of concern.  Lung cancer screening: If you have a greater than 20 pack year history of tobacco use, then you may qualify for lung cancer screening with a chest CT scan.   Please call your insurance company to inquire about coverage for this test.  We currently don't have screenings for other cancers besides breast, cervical, colon, and lung cancers.  If you have a strong family history of cancer or have other cancer screening concerns, please let me know.    Bone health: Get at least 150 minutes of aerobic exercise weekly Get weight bearing exercise at least once weekly Bone density test:  A bone density test is an imaging test that uses a type of X-ray to measure the amount of calcium and other minerals in your bones. The test may be used to diagnose or screen you for  a condition that causes weak or thin bones (osteoporosis), predict your risk for a broken bone (fracture), or determine how well your osteoporosis treatment is working. The bone density test is recommended for females 65 and older, or females or males <65 if certain risk factors such as thyroid disease, long term use of steroids such as for asthma or rheumatological issues, vitamin D  deficiency, estrogen deficiency, family history of osteoporosis, self or family history of fragility fracture in first degree relative.    Heart health: Get at least 150 minutes of aerobic exercise weekly Limit alcohol It is important to maintain a healthy blood pressure and healthy cholesterol numbers  Heart disease screening: Screening for heart disease includes screening for blood pressure, fasting lipids, glucose/diabetes screening, BMI height to weight ratio, reviewed of smoking status, physical activity, and diet.    Goals include blood pressure 120/80 or less, maintaining a healthy lipid/cholesterol profile, preventing diabetes or keeping diabetes numbers under good control, not smoking or using tobacco products, exercising most days per week or at least 150 minutes per week of exercise, and eating healthy variety of fruits and vegetables, healthy oils, and avoiding unhealthy food choices like fried food, fast food, high sugar and high cholesterol foods.    Other tests may possibly include EKG test, CT coronary calcium score, echocardiogram, exercise treadmill stress test.   EKG normal 2024  Medical care options: I recommend you continue to seek care here first for routine care.  We try really hard to have available appointments Monday through Friday daytime hours for sick visits, acute visits, and physicals.  Urgent care should be used for after hours and weekends for significant issues that cannot wait till the next day.  The emergency department should be used for significant potentially life-threatening  emergencies.  The emergency department is expensive, can often have long wait times for less significant concerns, so try to utilize primary care, urgent care, or telemedicine when possible to avoid unnecessary trips to the emergency department.  Virtual visits and telemedicine have been introduced since the pandemic started in 2020, and can be convenient ways to receive medical care.  We offer virtual appointments as well to assist you in a variety of options to seek medical care.   Advanced Directives: I recommend you consider  completing a Health Care Power of Attorney and Living Will.   These documents respect your wishes and help alleviate burdens on your loved ones if you were to become terminally ill or be in a position to need those documents enforced.    You can complete Advanced Directives yourself, have them notarized, then have copies made for our office, for you and for anybody you feel should have them in safe keeping.  Or, you can have an attorney prepare these documents.   If you haven't updated your Last Will and Testament in a while, it may be worthwhile having an attorney prepare these documents together and save on some costs.       Separate significant issues discussed: Iron deficiency, currently taking iron once weekly.  Referral to GI for eval  Morbid obesity  associated with prediabetes -counseled on diet, exercise -referral to weight loss study through [facility]  Prediabetes - work on health eating habits, regular exercise, weight loss, and we will update labs today  Vitamin D  deficiency - updated labs today, using low dose weekly supplement currently  chronic right knee pain - managed by orthopedics  Attention and focus concern - advised she start with consult with counseling.   Shylin was seen today for annual exam.  Diagnoses and all orders for this visit:  Encounter for health maintenance examination in adult -     Ambulatory referral to  Gastroenterology -     CBC with Differential/Platelet -     Comprehensive metabolic panel with GFR -     Lipid panel -     Hemoglobin A1c -     VITAMIN D  25 Hydroxy (Vit-D Deficiency, Fractures) -     Urinalysis, Routine w reflex microscopic -     Iron, TIBC and Ferritin Panel  Vaccine refused by patient  Vitamin D  deficiency -     VITAMIN D  25 Hydroxy (Vit-D Deficiency, Fractures)  Iron deficiency anemia, unspecified iron deficiency anemia type -     Ambulatory referral to Gastroenterology -     Iron, TIBC and Ferritin Panel  Chronic pain of right knee -     CBC with Differential/Platelet  Prediabetes -     Hemoglobin A1c  Morbid obesity (HCC)  Screening for lipid disorders -     Lipid panel  Screen for colon cancer -     Ambulatory referral to Gastroenterology  Attention and concentration deficit    Follow-up pending labs, yearly for physical

## 2024-04-07 NOTE — Progress Notes (Signed)
 Sent referral

## 2024-04-08 ENCOUNTER — Other Ambulatory Visit: Payer: Self-pay | Admitting: Medical

## 2024-04-08 ENCOUNTER — Ambulatory Visit: Payer: Self-pay | Admitting: Medical

## 2024-04-08 LAB — COMPREHENSIVE METABOLIC PANEL WITH GFR
ALT: 10 IU/L (ref 0–32)
AST: 14 IU/L (ref 0–40)
Albumin: 4.2 g/dL (ref 3.9–4.9)
Alkaline Phosphatase: 119 IU/L — ABNORMAL HIGH (ref 41–116)
BUN/Creatinine Ratio: 13 (ref 9–23)
BUN: 10 mg/dL (ref 6–24)
Bilirubin Total: 0.3 mg/dL (ref 0.0–1.2)
CO2: 21 mmol/L (ref 20–29)
Calcium: 9.2 mg/dL (ref 8.7–10.2)
Chloride: 105 mmol/L (ref 96–106)
Creatinine, Ser: 0.79 mg/dL (ref 0.57–1.00)
Globulin, Total: 3.2 g/dL (ref 1.5–4.5)
Glucose: 84 mg/dL (ref 70–99)
Potassium: 4.4 mmol/L (ref 3.5–5.2)
Sodium: 139 mmol/L (ref 134–144)
Total Protein: 7.4 g/dL (ref 6.0–8.5)
eGFR: 95 mL/min/1.73 (ref >59)

## 2024-04-08 LAB — IRON,TIBC AND FERRITIN PANEL
Ferritin: 12 ng/mL — AB (ref 15–150)
Iron Saturation: 5 % — AB (ref 15–55)
Iron: 22 ug/dL — AB (ref 27–159)
Total Iron Binding Capacity: 446 ug/dL (ref 250–450)
UIBC: 424 ug/dL (ref 131–425)

## 2024-04-08 LAB — CBC WITH DIFFERENTIAL/PLATELET
Basophils Absolute: 0 x10E3/uL (ref 0.0–0.2)
Basos: 1 %
EOS (ABSOLUTE): 0 x10E3/uL (ref 0.0–0.4)
Eos: 0 %
Hematocrit: 35 % (ref 34.0–46.6)
Hemoglobin: 10.7 g/dL — ABNORMAL LOW (ref 11.1–15.9)
Immature Grans (Abs): 0 x10E3/uL (ref 0.0–0.1)
Immature Granulocytes: 0 %
Lymphocytes Absolute: 2.5 x10E3/uL (ref 0.7–3.1)
Lymphs: 34 %
MCH: 24.9 pg — ABNORMAL LOW (ref 26.6–33.0)
MCHC: 30.6 g/dL — ABNORMAL LOW (ref 31.5–35.7)
MCV: 82 fL (ref 79–97)
Monocytes Absolute: 0.5 x10E3/uL (ref 0.1–0.9)
Monocytes: 7 %
Neutrophils Absolute: 4.2 x10E3/uL (ref 1.4–7.0)
Neutrophils: 58 %
Platelets: 412 x10E3/uL (ref 150–450)
RBC: 4.29 x10E6/uL (ref 3.77–5.28)
RDW: 16.4 % — ABNORMAL HIGH (ref 11.7–15.4)
WBC: 7.2 x10E3/uL (ref 3.4–10.8)

## 2024-04-08 LAB — URINALYSIS, ROUTINE W REFLEX MICROSCOPIC
Bilirubin, UA: NEGATIVE
Glucose, UA: NEGATIVE
Ketones, UA: NEGATIVE
Leukocytes,UA: NEGATIVE
Nitrite, UA: NEGATIVE
Specific Gravity, UA: 1.022 (ref 1.005–1.030)
Urobilinogen, Ur: 1 mg/dL (ref 0.2–1.0)
pH, UA: 7 (ref 5.0–7.5)

## 2024-04-08 LAB — LIPID PANEL
Chol/HDL Ratio: 4.4 ratio (ref 0.0–4.4)
Cholesterol, Total: 163 mg/dL (ref 100–199)
HDL: 37 mg/dL — ABNORMAL LOW (ref >39)
LDL Chol Calc (NIH): 104 mg/dL — ABNORMAL HIGH (ref 0–99)
Triglycerides: 124 mg/dL (ref 0–149)
VLDL Cholesterol Cal: 22 mg/dL (ref 5–40)

## 2024-04-08 LAB — MICROSCOPIC EXAMINATION: Renal Epithel, UA: NONE SEEN /LPF

## 2024-04-08 LAB — HEMOGLOBIN A1C
Est. average glucose Bld gHb Est-mCnc: 123 mg/dL
Hgb A1c MFr Bld: 5.9 % — ABNORMAL HIGH (ref 4.8–5.6)

## 2024-04-08 LAB — VITAMIN D 25 HYDROXY (VIT D DEFICIENCY, FRACTURES): Vit D, 25-Hydroxy: 21.5 ng/mL — AB (ref 30.0–100.0)

## 2024-04-08 MED ORDER — FERROUS GLUCONATE 324 (38 FE) MG PO TABS: 324.0000 mg | ORAL_TABLET | Freq: Two times a day (BID) | ORAL | 2 refills | Status: AC

## 2024-04-08 MED ORDER — VITAMIN D (ERGOCALCIFEROL) 1.25 MG (50000 UNIT) PO CAPS: 50000.0000 [IU] | ORAL_CAPSULE | ORAL | 2 refills | Status: AC

## 2024-04-08 NOTE — Progress Notes (Signed)
 Results to MyChart  Make sure we referred to [facility] weight loss study

## 2025-04-12 ENCOUNTER — Encounter: Admitting: Medical
